# Patient Record
Sex: Female | Born: 2000 | Race: White | Hispanic: No | Marital: Single | Smoking: Never smoker
Health system: Southern US, Community
[De-identification: ages and names within clinical notes are randomized; demographics above are authoritative.]

## PROBLEM LIST (undated history)

## (undated) DIAGNOSIS — F909 Attention-deficit hyperactivity disorder, unspecified type: Secondary | ICD-10-CM

## (undated) DIAGNOSIS — G4710 Hypersomnia, unspecified: Secondary | ICD-10-CM

## (undated) DIAGNOSIS — J45909 Unspecified asthma, uncomplicated: Secondary | ICD-10-CM

## (undated) DIAGNOSIS — E669 Obesity, unspecified: Secondary | ICD-10-CM

## (undated) DIAGNOSIS — D649 Anemia, unspecified: Secondary | ICD-10-CM

## (undated) DIAGNOSIS — F410 Panic disorder [episodic paroxysmal anxiety] without agoraphobia: Secondary | ICD-10-CM

## (undated) DIAGNOSIS — K219 Gastro-esophageal reflux disease without esophagitis: Secondary | ICD-10-CM

## (undated) DIAGNOSIS — R519 Headache, unspecified: Secondary | ICD-10-CM

## (undated) DIAGNOSIS — R011 Cardiac murmur, unspecified: Secondary | ICD-10-CM

## (undated) DIAGNOSIS — K3184 Gastroparesis: Secondary | ICD-10-CM

## (undated) DIAGNOSIS — R51 Headache: Secondary | ICD-10-CM

## (undated) DIAGNOSIS — A048 Other specified bacterial intestinal infections: Secondary | ICD-10-CM

## (undated) HISTORY — DX: Anemia, unspecified: D64.9

## (undated) HISTORY — DX: Hypersomnia, unspecified: G47.10

## (undated) HISTORY — PX: TYMPANOSTOMY TUBE PLACEMENT: SHX32

---
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
## 2018-04-19 ENCOUNTER — Emergency Department (HOSPITAL_COMMUNITY): Payer: Medicaid Other

## 2018-04-19 ENCOUNTER — Other Ambulatory Visit: Payer: Self-pay

## 2018-04-19 ENCOUNTER — Emergency Department (HOSPITAL_COMMUNITY)
Admission: EM | Admit: 2018-04-19 | Discharge: 2018-04-19 | Disposition: A | Discharge status: Home / Self Care | Payer: Medicaid Other | Attending: Emergency Medicine | Admitting: Emergency Medicine

## 2018-04-19 ENCOUNTER — Encounter (HOSPITAL_COMMUNITY): Payer: Self-pay | Admitting: Emergency Medicine

## 2018-04-19 DIAGNOSIS — S46911A Strain of unspecified muscle, fascia and tendon at shoulder and upper arm level, right arm, initial encounter: Secondary | ICD-10-CM

## 2018-04-19 DIAGNOSIS — Z79899 Other long term (current) drug therapy: Secondary | ICD-10-CM | POA: Diagnosis not present

## 2018-04-19 DIAGNOSIS — Y999 Unspecified external cause status: Secondary | ICD-10-CM | POA: Insufficient documentation

## 2018-04-19 DIAGNOSIS — Y9375 Activity, martial arts: Secondary | ICD-10-CM | POA: Diagnosis not present

## 2018-04-19 DIAGNOSIS — Y929 Unspecified place or not applicable: Secondary | ICD-10-CM | POA: Insufficient documentation

## 2018-04-19 DIAGNOSIS — S4991XA Unspecified injury of right shoulder and upper arm, initial encounter: Secondary | ICD-10-CM | POA: Diagnosis present

## 2018-04-19 DIAGNOSIS — W2209XA Striking against other stationary object, initial encounter: Secondary | ICD-10-CM | POA: Diagnosis not present

## 2018-04-19 DIAGNOSIS — J45909 Unspecified asthma, uncomplicated: Secondary | ICD-10-CM | POA: Insufficient documentation

## 2018-04-19 HISTORY — DX: Gastroparesis: K31.84

## 2018-04-19 MED ORDER — IBUPROFEN 600 MG PO TABS: 600.0000 mg | ORAL_TABLET | Freq: Four times a day (QID) | ORAL | 0 refills | Status: DC | PRN

## 2018-04-19 NOTE — ED Triage Notes (Signed)
Pt was rolling on the ground and felt her R shoulder pop. Reports she took naproxen and motrin without improvement.

## 2018-04-19 NOTE — Discharge Instructions (Addendum)
Apply ice packs on/off to your shoulder.  Only wear the sling for brief periods of time, remove it and gently move your shoulder to prevent your shoulder from locking up.  Call the orthopedic provider listed in one week if the symptoms are not improving.

## 2018-04-19 NOTE — ED Notes (Signed)
At martial arts class fell wrong onto shoulder   Now with shoulder pain and discomfort

## 2018-04-20 NOTE — ED Provider Notes (Signed)
[facility] EMERGENCY DEPARTMENT Provider Note   CSN: [id number] Arrival date & time: 04/19/18  2015     History   Chief Complaint Chief Complaint  Patient presents with  . Shoulder Pain    HPI Gina Scott is a 17 y.o. female.  HPI  Gina Scott is a 17 y.o. female who presents to the Emergency Department complaining of right shoulder pain and felt a "pop" in her shoulder two days ago after performing a jujitsu maneuver.  She describes pain with abduction of her arm.  Pain improves with her arm held to her side.  She has been wearing a sling.  She denies neck pain, swelling, chest pain or pain radiating down her arm.  Has taken aleve without relief.    Past Medical History:  Diagnosis Date  . ADHD (attention deficit hyperactivity disorder)   . Anemia   . Asthma   . Gastroparesis   . GERD (gastroesophageal reflux disease)   . H. pylori infection    PMH:  . Headache   . Heart murmur    as an infant  . Obesity (BMI 30-39.9)   . Panic attacks     There are no active problems to display for this patient.   Past Surgical History:  Procedure Laterality Date  . ESOPHAGOGASTRODUODENOSCOPY (EGD) WITH PROPOFOL N/A 05/21/2017   Procedure: ESOPHAGOGASTRODUODENOSCOPY (EGD) WITH PROPOFOL;  Surgeon: Adelene Amas, MD;  Location: [facility] ENDOSCOPY;  Service: Gastroenterology;  Laterality: N/A;  . TYMPANOSTOMY TUBE PLACEMENT       OB History   None      Home Medications    Prior to Admission medications   Medication Sig Start Date End Date Taking? Authorizing Provider  acetaminophen (TYLENOL) 500 MG tablet Take 1,000 mg by mouth every 6 (six) hours as needed for moderate pain.    [provider]  albuterol (PROVENTIL HFA;VENTOLIN HFA) 108 (90 Base) MCG/ACT inhaler Inhale 2 puffs into the lungs every 4 (four) hours as needed for wheezing or shortness of breath.     [provider]  amitriptyline (ELAVIL) 10 MG tablet 10 mg (1 tab) and adjust as  recommended by MD.  Max dose 5 per day. 12/10/17   Adelene Amas, MD  Carnitine 250 MG CAPS Take 4 capsules (1,000 mg total) by mouth 2 (two) times daily. 04/04/17   Adelene Amas, MD  Coenzyme Q10 (COQ-10) 100 MG CAPS Take 1 capsule by mouth 2 (two) times daily. 04/04/17   Adelene Amas, MD  esomeprazole (NEXIUM) 40 MG capsule Take 1 capsule 20 minutes before a meal, daily. 05/27/17   Adelene Amas, MD  ibuprofen (ADVIL,MOTRIN) 600 MG tablet Take 1 tablet (600 mg total) by mouth every 6 (six) hours as needed. 04/19/18   Elo Marmolejos, PA-C  IRON PO Take 65 mg by mouth daily.    [provider]  magic mouthwash w/lidocaine SOLN Take 15 mls into mouth, gargle, and spit out up to 3 x per day 05/22/17   Adelene Amas, MD  metroNIDAZOLE (METROGEL VAGINAL) 0.75 % vaginal gel Place 1 Applicatorful vaginally at bedtime. X 5 nights, no alcohol or sex while using 09/18/16   Cheral Marker, CNM  omeprazole (PRILOSEC) 20 MG capsule Take 20 mg by mouth daily.    [provider]  ondansetron (ZOFRAN ODT) 8 MG disintegrating tablet Take 1 tablet (8 mg total) by mouth every 8 (eight) hours as needed for nausea or vomiting. 10/03/17   Adelene Amas, MD  Probiotic Product (  ALIGN PO) Take by mouth.    [provider]  promethazine (PHENERGAN) 25 MG suppository Place 1 suppository (25 mg total) rectally every 6 (six) hours as needed for nausea or vomiting. 10/31/17   Adelene AmasQuan, Richard, MD  promethazine (PHENERGAN) 25 MG tablet Take 1 tablet (25 mg total) by mouth every 6 (six) hours as needed for nausea or vomiting. 10/31/17   Adelene AmasQuan, Richard, MD  sucralfate (CARAFATE) 1 GM/10ML suspension Take 10 mLs (1 g total) by mouth 4 (four) times daily -  with meals and at bedtime. Gargle and spit out. 05/22/17   Adelene AmasQuan, Richard, MD    Family History Family History  Problem Relation Age of Onset  . Stroke Maternal Grandmother   . Hypertension Mother   . Multiple sclerosis Mother     Social History Social  History   Tobacco Use  . Smoking status: Never Smoker  . Smokeless tobacco: Never Used  Substance Use Topics  . Alcohol use: No  . Drug use: No     Allergies   Dairy aid [lactase]   Review of Systems Review of Systems  Constitutional: Negative for chills and fever.  Respiratory: Negative for shortness of breath.   Cardiovascular: Negative for chest pain.  Musculoskeletal: Positive for arthralgias (right shoulder pain). Negative for joint swelling, neck pain and neck stiffness.  Skin: Negative for rash and wound.  Neurological: Negative for weakness and numbness.     Physical Exam Updated Vital Signs BP 123/88 (BP Location: Left Arm)   Pulse 99   Temp (!) 97.2 F (36.2 C) (Tympanic)   Resp 17   Ht 5\' 4"  (1.626 m)   Wt 86.6 kg (191 lb)   LMP 04/12/2018   SpO2 98%   BMI 32.79 kg/m   Physical Exam  Cardiovascular: Normal rate, regular rhythm and intact distal pulses.  Pulmonary/Chest: Effort normal and breath sounds normal. No respiratory distress. She exhibits no tenderness.  Musculoskeletal: She exhibits tenderness. She exhibits no edema.  ttp of the anterior right shoulder and right trapezius muscle through ROM.  No cervical spine or paraspinal muscle tenderness.  Grip strength is strong and symmetrical.  Neurological: She is alert. No sensory deficit.  Skin: Skin is warm. Capillary refill takes less than 2 seconds. No rash noted. No erythema.  Psychiatric: She has a normal mood and affect.  Nursing note and vitals reviewed.    ED Treatments / Results  Labs (all labs ordered are listed, but only abnormal results are displayed) Labs Reviewed - No data to display  EKG None  Radiology Dg Shoulder Right  Result Date: 04/19/2018 CLINICAL DATA:  Right shoulder pain after injury EXAM: RIGHT SHOULDER - 2+ VIEW COMPARISON:  None. FINDINGS: There is no evidence of fracture or dislocation. There is no evidence of arthropathy or other focal bone abnormality. Soft  tissues are unremarkable. IMPRESSION: Negative. Electronically Signed   By: Delbert PhenixJason A Poff M.D.   On: 04/19/2018 20:48    Procedures Procedures (including critical care time)  Medications Ordered in ED Medications - No data to display   Initial Impression / Assessment and Plan / ED Course  I have reviewed the triage vital signs and the nursing notes.  Pertinent labs & imaging results that were available during my care of the patient were reviewed by me and considered in my medical decision making (see chart for details).    XR of shoulder is neg for fx.  NV intact.  No focal neuro deficit.  Likely strain, but  also discussed possibility of rotator cuff injury and need for close orthopedic f/u.  Pt agrees to plan.  Return precautions discussed. Also advised pt not to wear shoulder sling for extended time due to risk of adhesive capsulitis.     Final Clinical Impressions(s) / ED Diagnoses   Final diagnoses:  Strain of right shoulder, initial encounter    ED Discharge Orders        Ordered    ibuprofen (ADVIL,MOTRIN) 600 MG tablet  Every 6 hours PRN     04/19/18 2104       Pauline Aus, PA-C 04/20/18 2019    Benjiman Core, MD 04/20/18 2348

## 2018-06-19 ENCOUNTER — Encounter: Payer: Self-pay | Admitting: Internal Medicine

## 2018-08-04 DIAGNOSIS — E669 Obesity, unspecified: Secondary | ICD-10-CM | POA: Diagnosis not present

## 2018-08-04 DIAGNOSIS — R2689 Other abnormalities of gait and mobility: Secondary | ICD-10-CM | POA: Diagnosis not present

## 2018-08-04 DIAGNOSIS — Z308 Encounter for other contraceptive management: Secondary | ICD-10-CM | POA: Diagnosis not present

## 2018-08-04 DIAGNOSIS — M79672 Pain in left foot: Secondary | ICD-10-CM | POA: Diagnosis not present

## 2018-08-04 DIAGNOSIS — M799 Soft tissue disorder, unspecified: Secondary | ICD-10-CM | POA: Diagnosis not present

## 2018-08-04 DIAGNOSIS — Z23 Encounter for immunization: Secondary | ICD-10-CM | POA: Diagnosis not present

## 2018-08-05 ENCOUNTER — Other Ambulatory Visit (HOSPITAL_COMMUNITY): Payer: Self-pay | Admitting: Family Medicine

## 2018-08-05 ENCOUNTER — Ambulatory Visit: Payer: BLUE CROSS/BLUE SHIELD | Admitting: Gastroenterology

## 2018-08-05 ENCOUNTER — Encounter: Payer: Self-pay | Admitting: Gastroenterology

## 2018-08-05 ENCOUNTER — Ambulatory Visit (HOSPITAL_COMMUNITY)
Admission: RE | Admit: 2018-08-05 | Discharge: 2018-08-05 | Disposition: A | Discharge status: Home / Self Care | Payer: BLUE CROSS/BLUE SHIELD | Source: Ambulatory Visit | Attending: Family Medicine | Admitting: Family Medicine

## 2018-08-05 DIAGNOSIS — R1013 Epigastric pain: Secondary | ICD-10-CM | POA: Diagnosis not present

## 2018-08-05 DIAGNOSIS — R112 Nausea with vomiting, unspecified: Secondary | ICD-10-CM | POA: Diagnosis not present

## 2018-08-05 DIAGNOSIS — M79672 Pain in left foot: Secondary | ICD-10-CM | POA: Insufficient documentation

## 2018-08-05 DIAGNOSIS — K219 Gastro-esophageal reflux disease without esophagitis: Secondary | ICD-10-CM

## 2018-08-05 MED ORDER — PANTOPRAZOLE SODIUM 40 MG PO TBEC: 40.0000 mg | DELAYED_RELEASE_TABLET | Freq: Every day | ORAL | 3 refills | Status: DC

## 2018-08-05 NOTE — Patient Instructions (Signed)
Start pantoprazole 40 mg daily before your first meal of the day.  Prescription sent to your pharmacy.  This medicine is for dyspepsia and acid reflux (your biopsies previously showed reflux).  Try adjusting when you take amitriptyline, take at bedtime. The following are the instructions provided by your pediatric GI doctor when you were started on amitriptyline. The goal is to have your symptoms adequately controlled but take lowest minimum dose.   Start with 1 tablet at bed for 2 weeks and then will increase by 1/2 a tablet every 2 wks with a max dose of 5 tabs.  1.5 tabs x 2 wks  2 tabs x 2 wks  2.5 tabs x 2 wks  3 tabs x 2 wks  3.5 tabs x 2 wks  4 tabs x 2 wks  4.5 tabs x 2 wks  5 tabs x 2 wks   Return to the office in two months to meet Dr. Jena Gauss and follow up your symptoms. Call in interim if needed.

## 2018-08-05 NOTE — Progress Notes (Signed)
Primary Care Physician:  Elfredia Nevins, MD  Primary Gastroenterologist:  Roetta Sessions, MD   Chief Complaint  Patient presents with  . Constipation    states she has gastroparesis  . Nausea  . Emesis    HPI:  Gina Scott is a 17 y.o. female here to establish care.  Previously followed by pediatric gastroenterologist Dr. Adelene Amas.  Patient gives a history of gastroparesis although I don't see documentation of gastric emptying study. Patient reports her mother has gastroparesis and Dr. Cloretta Ned never checked her for it.  Last seen Dr. Cloretta Ned in February 2019 for follow-up for nausea, constipation, reflux, vomiting.  According to his note, patient has previously tried co-Q10, l-carnitine, riboflavin, magnesium.  Phenergan has been most effective.  Zofran ineffective.  Started on amitriptyline about 6 months ago.  Treatment plan included involving pediatric neurology if she failed amitriptyline therapy, to exclude significant neurological disease and to help manage her symptoms.  EGD back in July, showed erythematous mucosa in the antrum, small bowel biopsies negative, esophageal biopsies consistent with reflux, gastric biopsies with active gastropathy but no H. pylori.  H. pylori stool antigen negative in June 2018.  She reports being treated for H. pylori several years back.  Reviewed available records in epic.  She also saw Dr. Julien Girt, pediatric gastroenterology at [facility] in May 2017.  At that time patient was reporting heartburn and reflux before vomiting.  For H. pylori at that time.  Suspected lactose intolerance, advised to begin dairy free diet.  Noted some improvement in her symptoms at that time.  She also had a food allergy test May 2017 which tested negative for egg white, cows milk, fish cod, wheat, peanut, soy, egg yolk, corn.  CRP was normal at 5.6.  In June 2018, CRP normal, sed rate normal, lactoferrin negative, H. pylori stool antigen negative,  Hemoccult negative.  Ferritin 21, iron 32, TIBC 360, iron saturations 9% all normal.  Patient states that her symptoms are fairly well-controlled currently but there is some room for improvement.  He takes her amitriptyline 10 mg every morning.  She always wakes up feeling nauseated.  Easily her nausea will settle down, sometimes takes an additional amitriptyline later in the day.  Usually does this about once every couple of weeks.  Denies associated drowsiness.  She states she is awaiting sleep study because of personal and family history of hypersomnolence.  Vomiting rare at this point.  Bowel movements fairly regular, at least 4 times weekly, Bristol 4 and Bristol 6.  No melena or rectal bleeding.  Denies heartburn.  Does have some epigastric burning at times.  Patient has gained 20 pounds in the past 2 years, up 7 pounds in the past 3 months.  Current Outpatient Medications  Medication Sig Dispense Refill  . acetaminophen (TYLENOL) 500 MG tablet Take 1,000 mg by mouth every 6 (six) hours as needed for moderate pain.    Marland Kitchen albuterol (PROVENTIL HFA;VENTOLIN HFA) 108 (90 Base) MCG/ACT inhaler Inhale 2 puffs into the lungs every 4 (four) hours as needed for wheezing or shortness of breath.     Marland Kitchen amitriptyline (ELAVIL) 10 MG tablet 10 mg (1 tab) and adjust as recommended by MD.  Max dose 5 per day. (Patient taking differently: Take 10 mg by mouth daily. 10 mg (1 tab) and adjust as recommended by MD.  Max dose 5 per day.) 150 tablet 1  . Coenzyme Q10 (COQ-10) 100 MG CAPS Take 1 capsule by  mouth 2 (two) times daily. 60 each 1  . ondansetron (ZOFRAN ODT) 8 MG disintegrating tablet Take 1 tablet (8 mg total) by mouth every 8 (eight) hours as needed for nausea or vomiting. 20 tablet 0   No current facility-administered medications for this visit.     Allergies as of 08/05/2018 - Review Complete 08/05/2018  Allergen Reaction Noted  . Dairy aid [lactase]  12/14/2016    Past Medical History:  Diagnosis  Date  . ADHD (attention deficit hyperactivity disorder)   . Anemia   . Asthma   . Gastroparesis   . GERD (gastroesophageal reflux disease)   . H. pylori infection    PMH:  . Headache   . Heart murmur    as an infant  . Obesity (BMI 30-39.9)   . Panic attacks     Past Surgical History:  Procedure Laterality Date  . ESOPHAGOGASTRODUODENOSCOPY (EGD) WITH PROPOFOL N/A 05/21/2017   Dr. Cloretta Ned: Erythematous mucosa in the antrum, gastric biopsies with active gastropathy but no H. pylori, small bowel biopsies negative, esophageal biopsies consistent with reflux.  . TYMPANOSTOMY TUBE PLACEMENT      Family History  Problem Relation Age of Onset  . Stroke Maternal Grandmother   . Hypertension Mother   . Multiple sclerosis Mother   . Irritable bowel syndrome Mother   . Other Mother        gastroparesis  . Inflammatory bowel disease Neg Hx   . Colon cancer Neg Hx   . Liver cancer Neg Hx   . Cirrhosis Neg Hx     Social History   Socioeconomic History  . Marital status: Single    Spouse name: Not on file  . Number of children: Not on file  . Years of education: Not on file  . Highest education level: Not on file  Occupational History  . Not on file  Social Needs  . Financial resource strain: Not on file  . Food insecurity:    Worry: Not on file    Inability: Not on file  . Transportation needs:    Medical: Not on file    Non-medical: Not on file  Tobacco Use  . Smoking status: Never Smoker  . Smokeless tobacco: Never Used  Substance and Sexual Activity  . Alcohol use: No  . Drug use: No  . Sexual activity: Never  Lifestyle  . Physical activity:    Days per week: Not on file    Minutes per session: Not on file  . Stress: Not on file  Relationships  . Social connections:    Talks on phone: Not on file    Gets together: Not on file    Attends religious service: Not on file    Active member of club or organization: Not on file    Attends meetings of clubs or  organizations: Not on file    Relationship status: Not on file  . Intimate partner violence:    Fear of current or ex partner: Not on file    Emotionally abused: Not on file    Physically abused: Not on file    Forced sexual activity: Not on file  Other Topics Concern  . Not on file  Social History Narrative  . Not on file      ROS:  General: Negative for anorexia, weight loss, fever, chills, fatigue, weakness. Eyes: Negative for vision changes.  ENT: Negative for hoarseness, difficulty swallowing , nasal congestion. CV: Negative for chest pain, angina, palpitations, dyspnea on  exertion, peripheral edema.  Respiratory: Negative for dyspnea at rest, dyspnea on exertion, cough, sputum, wheezing.  GI: See history of present illness. GU:  Negative for dysuria, hematuria, urinary incontinence, urinary frequency, nocturnal urination.  MS: Negative low back pain.  Left foot pain, x-rays pending. Derm: Negative for rash or itching.  Neuro: Negative for weakness, abnormal sensation, seizure, frequent headaches, memory loss, confusion.  Psych: Negative for anxiety, depression, suicidal ideation, hallucinations.  Endo: Negative for unusual weight change.  Heme: Negative for bruising or bleeding. Allergy: Negative for rash or hives.    Physical Examination:  BP 138/74   Pulse 100   Temp (!) 97.4 F (36.3 C) (Oral)   Ht 5\' 4"  (1.626 m)   Wt 198 lb 12.8 oz (90.2 kg)   LMP 08/04/2018 (Exact Date)   BMI 34.12 kg/m    General: Well-nourished, well-developed in no acute distress.  Head: Normocephalic, atraumatic.   Eyes: Conjunctiva pink, no icterus. Mouth: Oropharyngeal mucosa moist and pink , no lesions erythema or exudate. Neck: Supple without thyromegaly, masses, or lymphadenopathy.  Lungs: Clear to auscultation bilaterally.  Heart: Regular rate and rhythm, no murmurs rubs or gallops.  Abdomen: Bowel sounds are normal, nontender, nondistended, no hepatosplenomegaly or masses, no  abdominal bruits or    hernia , no rebound or guarding.   Rectal: not performed Extremities: No lower extremity edema. No clubbing or deformities.  Neuro: Alert and oriented x 4 , grossly normal neurologically.  Skin: Warm and dry, no rash or jaundice.   Psych: Alert and cooperative, normal mood and affect.  Labs: See above  Imaging Studies: No results found.

## 2018-08-05 NOTE — Assessment & Plan Note (Addendum)
17 year old female with history of chronic nausea, intermittent vomiting, epigastric burning previously followed by pediatric gastroenterology.  Food allergy testing negative. Small bowel biopsy negative for celiac. At some point in the past 6 months she has come off of PPI.  Continues to have some epigastric burning, nausea predominantly in the morning but rarely vomiting at this point.  I do not see a clear diagnosis of gastroparesis patient reports.  She admits to no specific testing which is a gastric emptying study.  Pediatric notes suspect IBS, dyspepsia, GERD.  At this point, would recommend optimizing her amitriptyline which she was started on about 6 months ago.  Would ask her to take dose at bedtime.  Right now she is only on 10 mg daily would likely benefit from increased dose.  Instructions for step up therapy as outlined by her previous gastroenterologist provided with patient today.  She would likely not need much more than 20 mg at bedtime control her symptoms based on how she is doing currently on 10 mg.  We will add back PPI.  Have her return to the office 2 months to see Dr. Jena Gauss.  She will call sooner if needed.

## 2018-08-06 NOTE — Progress Notes (Signed)
cc'ed to pcp °

## 2018-09-30 ENCOUNTER — Encounter: Payer: Self-pay | Admitting: Internal Medicine

## 2018-09-30 ENCOUNTER — Ambulatory Visit: Payer: BLUE CROSS/BLUE SHIELD | Admitting: Internal Medicine

## 2018-09-30 ENCOUNTER — Telehealth: Payer: Self-pay

## 2018-09-30 VITALS — BP 147/89 | HR 113 | Temp 97.8°F | Ht 64.0 in | Wt 201.2 lb

## 2018-09-30 DIAGNOSIS — K219 Gastro-esophageal reflux disease without esophagitis: Secondary | ICD-10-CM | POA: Diagnosis not present

## 2018-09-30 NOTE — Patient Instructions (Addendum)
Continue amitriptyline 30 mg at bedtime  Try protonix (pantoprazole) 40 mg daily (disp 30 with 11 refilles  Stop Nexiium  Gastroparesis / GERD diet information  Office visit in 8 weeks

## 2018-09-30 NOTE — Progress Notes (Signed)
Primary Care Physician:  Elfredia Nevins, MD Primary Gastroenterologist:  Dr. Jena Gauss  Pre-Procedure History & Physical: HPI:  Gina Scott is a 17 y.o. female here for follow-up of GERD/gastroparesis.  Seen 2 months ago.  Started on Protonix but did not actually get the prescription filled.  We checked with the pharmacy it was actually filled but not picked up.  She continues on esomeprazole 20 mg daily.  Can't  tell a great deal of difference 1 way or other she takes as omeprazole or does not.  Still has upper abdominal discomfort and typical reflux symptoms.  No dysphagia.  Rare nausea.  Rarely takes a Phenergan.  Taking amitriptyline 30 mg at bedtime.  This helps her sleep very well.  Feels it does settle her stomach down to some degree.  Little dry mouth.  No constipation or urinary tract symptoms.  Past Medical History:  Diagnosis Date  . ADHD (attention deficit hyperactivity disorder)   . Anemia   . Asthma   . Gastroparesis   . GERD (gastroesophageal reflux disease)   . H. pylori infection    PMH:  . Headache   . Heart murmur    as an infant  . Obesity (BMI 30-39.9)   . Panic attacks     Past Surgical History:  Procedure Laterality Date  . ESOPHAGOGASTRODUODENOSCOPY (EGD) WITH PROPOFOL N/A 05/21/2017   Dr. Cloretta Ned: Erythematous mucosa in the antrum, gastric biopsies with active gastropathy but no H. pylori, small bowel biopsies negative, esophageal biopsies consistent with reflux.  . TYMPANOSTOMY TUBE PLACEMENT      Prior to Admission medications   Medication Sig Start Date End Date Taking? Authorizing Provider  acetaminophen (TYLENOL) 500 MG tablet Take 1,000 mg by mouth every 6 (six) hours as needed for moderate pain.   Yes [provider]  albuterol (PROVENTIL HFA;VENTOLIN HFA) 108 (90 Base) MCG/ACT inhaler Inhale 2 puffs into the lungs every 4 (four) hours as needed for wheezing or shortness of breath.    Yes [provider]  amitriptyline (ELAVIL) 10  MG tablet 10 mg (1 tab) and adjust as recommended by MD.  Max dose 5 per day. Patient taking differently: Take 30 mg by mouth daily. 10 mg (1 tab) and adjust as recommended by MD.  Max dose 5 per day. 12/10/17  Yes Adelene Amas, MD  Coenzyme Q10 (COQ-10) 100 MG CAPS Take 1 capsule by mouth 2 (two) times daily. 04/04/17  Yes Adelene Amas, MD  esomeprazole (NEXIUM) 20 MG capsule Take 20 mg by mouth daily.   Yes [provider]  ondansetron (ZOFRAN ODT) 8 MG disintegrating tablet Take 1 tablet (8 mg total) by mouth every 8 (eight) hours as needed for nausea or vomiting. Patient not taking: Reported on 09/30/2018 10/03/17   Adelene Amas, MD  pantoprazole (PROTONIX) 40 MG tablet Take 1 tablet (40 mg total) by mouth daily before breakfast. Patient not taking: Reported on 09/30/2018 08/05/18   Tiffany Kocher, PA-C    Allergies as of 09/30/2018 - Review Complete 09/30/2018  Allergen Reaction Noted  . Dairy aid [lactase]  12/14/2016    Family History  Problem Relation Age of Onset  . Stroke Maternal Grandmother   . Hypertension Mother   . Multiple sclerosis Mother   . Irritable bowel syndrome Mother   . Other Mother        gastroparesis  . Inflammatory bowel disease Neg Hx   . Colon cancer Neg Hx   . Liver cancer Neg  Hx   . Cirrhosis Neg Hx     Social History   Socioeconomic History  . Marital status: Single    Spouse name: Not on file  . Number of children: Not on file  . Years of education: Not on file  . Highest education level: Not on file  Occupational History  . Not on file  Social Needs  . Financial resource strain: Not on file  . Food insecurity:    Worry: Not on file    Inability: Not on file  . Transportation needs:    Medical: Not on file    Non-medical: Not on file  Tobacco Use  . Smoking status: Never Smoker  . Smokeless tobacco: Never Used  Substance and Sexual Activity  . Alcohol use: No  . Drug use: No  . Sexual activity: Never  Lifestyle  .  Physical activity:    Days per week: Not on file    Minutes per session: Not on file  . Stress: Not on file  Relationships  . Social connections:    Talks on phone: Not on file    Gets together: Not on file    Attends religious service: Not on file    Active member of club or organization: Not on file    Attends meetings of clubs or organizations: Not on file    Relationship status: Not on file  . Intimate partner violence:    Fear of current or ex partner: Not on file    Emotionally abused: Not on file    Physically abused: Not on file    Forced sexual activity: Not on file  Other Topics Concern  . Not on file  Social History Narrative  . Not on file    Review of Systems: See HPI, otherwise negative ROS  Physical Exam: BP (!) 147/89   Pulse (!) 113   Temp 97.8 F (36.6 C) (Oral)   Ht 5\' 4"  (1.626 m)   Wt 201 lb 3.2 oz (91.3 kg)   LMP 09/02/2018   BMI 34.54 kg/m  General:   Alert, somewhat depressed mood and flat affect otherwise pleasant and cooperative in NAD Lungs:  Clear throughout to auscultation.   No wheezes, crackles, or rhonchi. No acute distress. Heart:  Regular rate and rhythm; no murmurs, clicks, rubs,  or gallops. Abdomen: Non-distended, normal bowel sounds.  Soft and nontender without appreciable mass or hepatosplenomegaly.  Pulses:  Normal pulses noted. Extremities:  Without clubbing or edema.  Impression: 17 year old lady with ADHD, GERD, diagnosis of gastroparesis made elsewhere; she continues to have some symptoms consistent with dyspepsia and GERD.  A degree of symptomatic gastroparesis not excluded.  She did not get her Protonix filled.  A stronger dose of PPI may be be of great benefit here.   Recommendations: Continue amitriptyline 30 mg at bedtime  Try protonix (pantoprazole) 40 mg daily (disp 30 with 11 refilles)  Stop Nexiium  Gastroparesis / GERD diet information  Office visit in 8 weeks    Notice: This dictation was prepared with  Dragon dictation along with smaller phrase technology. Any transcriptional errors that result from this process are unintentional and may not be corrected upon review.

## 2018-09-30 NOTE — Telephone Encounter (Signed)
Pt told Dr. Jena Gaussourk she never got Protonix in October 2019. I called [facility] [place], who said it was filled on 08/05/2018 and not picked up, then put back in stock. He said he could fill it and I asked him to do so. Pt is aware to pick up the Rx, said she will need to wait til she gets paid next week.  [place] said it would be $14.00 and pt is aware, said she will have to wait til next week when she gets pd to pick it up. I told her to make sure she communicates with the pharmacist on the prescription.

## 2018-11-25 ENCOUNTER — Ambulatory Visit: Payer: BLUE CROSS/BLUE SHIELD | Admitting: Internal Medicine

## 2018-11-25 ENCOUNTER — Encounter: Payer: Self-pay | Admitting: Internal Medicine

## 2018-11-25 VITALS — BP 144/86 | HR 118 | Temp 97.3°F | Ht 64.0 in | Wt 197.2 lb

## 2018-11-25 DIAGNOSIS — K219 Gastro-esophageal reflux disease without esophagitis: Secondary | ICD-10-CM

## 2018-11-25 DIAGNOSIS — R194 Change in bowel habit: Secondary | ICD-10-CM | POA: Diagnosis not present

## 2018-11-25 NOTE — Progress Notes (Signed)
Primary Care Physician:  Elfredia NevinsFusco, Lawrence, MD Primary Gastroenterologist:  Dr. Jena Gaussourk  Pre-Procedure History & Physical: HPI:  Gina Scott is a 18 y.o. female here for follow-up of GERD.  She is actually doing very well now on a higher dose of PPI.  Reflux symptoms well controlled.  No odynophagia or dysphagia.  Denies abdominal pain;  weight up 1 pound since she was last here.  No change in bowel habits.  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  Now taking amitriptyline 30 mg nightly-tolerating it well.  Prescribed by pediatric gastroenterology.  Past Medical History:  Diagnosis Date  . ADHD (attention deficit hyperactivity disorder)   . Anemia   . Asthma   . Gastroparesis   . GERD (gastroesophageal reflux disease)   . H. pylori infection    PMH:  . Headache   . Heart murmur    as an infant  . Obesity (BMI 30-39.9)   . Panic attacks     Past Surgical History:  Procedure Laterality Date  . ESOPHAGOGASTRODUODENOSCOPY (EGD) WITH PROPOFOL N/A 05/21/2017   Dr. Cloretta NedQuan: Erythematous mucosa in the antrum, gastric biopsies with active gastropathy but no H. pylori, small bowel biopsies negative, esophageal biopsies consistent with reflux.  . TYMPANOSTOMY TUBE PLACEMENT      Prior to Admission medications   Medication Sig Start Date End Date Taking? Authorizing Provider  albuterol (PROVENTIL HFA;VENTOLIN HFA) 108 (90 Base) MCG/ACT inhaler Inhale 2 puffs into the lungs every 4 (four) hours as needed for wheezing or shortness of breath.    Yes [provider]  amitriptyline (ELAVIL) 10 MG tablet 10 mg (1 tab) and adjust as recommended by MD.  Max dose 5 per day. Patient taking differently: Take 30 mg by mouth daily. 10 mg (1 tab) and adjust as recommended by MD.  Max dose 5 per day. 12/10/17  Yes Adelene AmasQuan, Richard, MD  Coenzyme Q10 (COQ-10) 100 MG CAPS Take 1 capsule by mouth 2 (two)  times daily. 04/04/17  Yes Adelene AmasQuan, Richard, MD  pantoprazole (PROTONIX) 40 MG tablet Take 1 tablet (40 mg total) by mouth daily before breakfast. 08/05/18  Yes Tiffany KocherLewis, Leslie S, PA-C  acetaminophen (TYLENOL) 500 MG tablet Take 1,000 mg by mouth every 6 (six) hours as needed for moderate pain.    [provider]  esomeprazole (NEXIUM) 20 MG capsule Take 20 mg by mouth daily.    [provider]  ondansetron (ZOFRAN ODT) 8 MG disintegrating tablet Take 1 tablet (8 mg total) by mouth every 8 (eight) hours as needed for nausea or vomiting. Patient not taking: Reported on 09/30/2018 10/03/17   Adelene AmasQuan, Richard, MD    Allergies as of 11/25/2018 - Review Complete 11/25/2018  Allergen Reaction Noted  . Dairy aid [lactase]  12/14/2016    Family History  Problem Relation Age of Onset  . Stroke Maternal Grandmother   . Hypertension Mother   . Multiple sclerosis Mother   . Irritable bowel syndrome Mother   . Other Mother        gastroparesis  . Inflammatory bowel disease Neg Hx   . Colon cancer Neg Hx   . Liver cancer Neg Hx   . Cirrhosis Neg Hx     Social History   Socioeconomic History  . Marital status: Single    Spouse name: Not on file  . Number of children: Not on file  . Years  of education: Not on file  . Highest education level: Not on file  Occupational History  . Not on file  Social Needs  . Financial resource strain: Not on file  . Food insecurity:    Worry: Not on file    Inability: Not on file  . Transportation needs:    Medical: Not on file    Non-medical: Not on file  Tobacco Use  . Smoking status: Never Smoker  . Smokeless tobacco: Never Used  Substance and Sexual Activity  . Alcohol use: No  . Drug use: No  . Sexual activity: Never  Lifestyle  . Physical activity:    Days per week: Not on file    Minutes per session: Not on file  . Stress: Not on file  Relationships  . Social connections:    Talks on phone: Not on file    Gets together: Not  on file    Attends religious service: Not on file    Active member of club or organization: Not on file    Attends meetings of clubs or organizations: Not on file    Relationship status: Not on file  . Intimate partner violence:    Fear of current or ex partner: Not on file    Emotionally abused: Not on file    Physically abused: Not on file    Forced sexual activity: Not on file  Other Topics Concern  . Not on file  Social History Narrative  . Not on file    Review of Systems: See HPI, otherwise negative ROS  Physical Exam: BP (!) 144/86   Pulse (!) 118   Temp (!) 97.3 F (36.3 C) (Oral)   Ht 5\' 4"  (1.626 m)   Wt 197 lb 3.2 oz (89.4 kg)   LMP 11/19/2018   BMI 33.85 kg/m  General:   Alert,  Well-developed, well-nourished, pleasant and cooperative in NAD Neck:  Supple; no masses or thyromegaly. No significant cervical adenopathy. Lungs:  Clear throughout to auscultation.   No wheezes, crackles, or rhonchi. No acute distress. Heart:  Regular rate and rhythm; no murmurs, clicks, rubs,  or gallops. Abdomen: Non-distended, normal bowel sounds.  Soft and nontender without appreciable mass or hepatosplenomegaly.     Impression/Plan: Pleasant 18 year old lady with GI issues that amount to GERD which are now well controlled on Protonix 40 mg daily.  She is virtually asymptomatic from a GI standpoint otherwise.  She reportedly has a family history of gastroparesis.  She currently has no symptoms consistent with that entity nor she had a recent emptying study.  Recommendations:Continue protonix 40 mg daily  OK to continue amitriptyline.  GERD information provided  Office visit in 1 year   Notice: This dictation was prepared with Dragon dictation along with smaller phrase technology. Any transcriptional errors that result from this process are unintentional and may not be corrected upon review.

## 2018-11-25 NOTE — Patient Instructions (Signed)
Continue protonix 40 mg daily  GERD information provided  Office visit in 1 year

## 2019-01-14 ENCOUNTER — Telehealth: Payer: Self-pay

## 2019-01-14 MED ORDER — AMITRIPTYLINE HCL 10 MG PO TABS: 30.0000 mg | ORAL_TABLET | Freq: Every day | ORAL | 3 refills | Status: DC

## 2019-01-14 NOTE — Telephone Encounter (Signed)
RX completed. 

## 2019-01-14 NOTE — Telephone Encounter (Signed)
Pt's mother called office requesting refill for Amitriptyline. Spoke to pt, she is taking 30mg  at bedtime. [employer].

## 2019-04-12 IMAGING — DX DG ABDOMEN 1V
1 series · 1 of 1 positions shown · non-contrast
Comparison: None.

CLINICAL DATA: Constipation times 1-2 months. Last bowel movement
yesterday.

EXAM:
ABDOMEN - 1 VIEW

[dg abd 1 view]
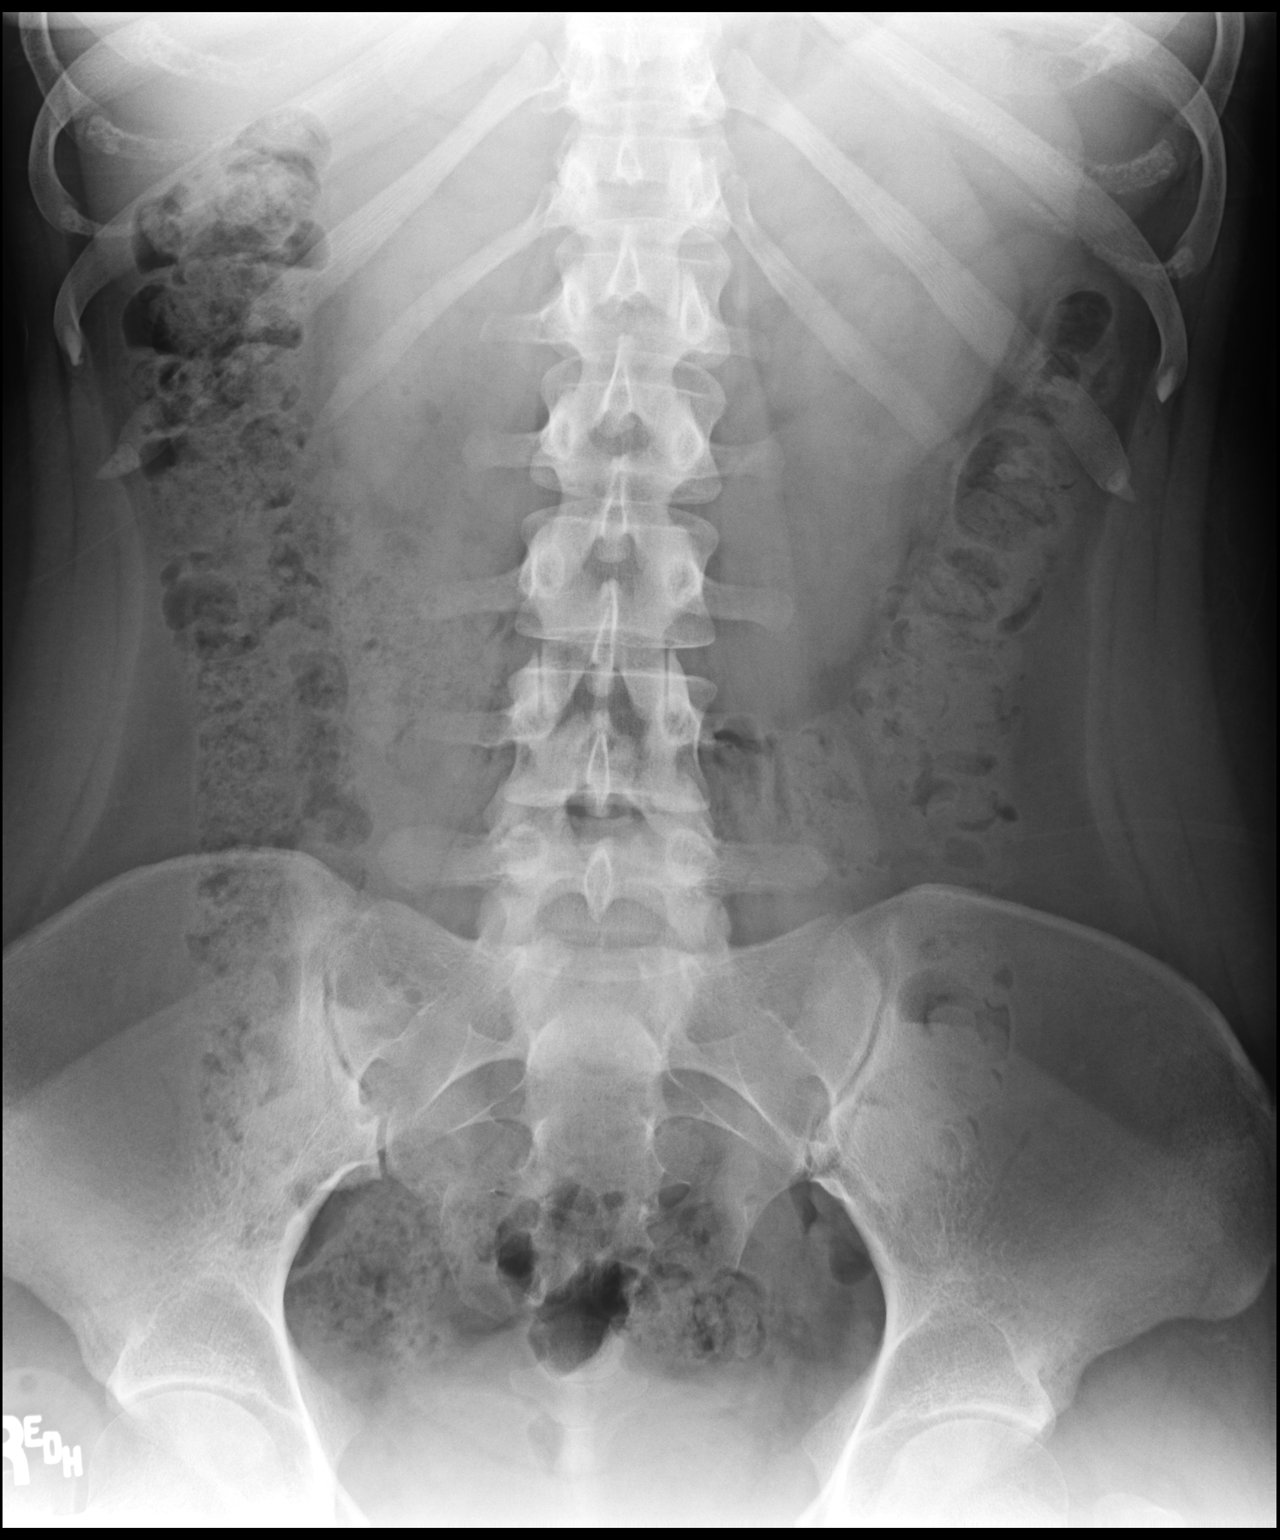

[1 of 1 positions shown; findings below may reference images not displayed]

FINDINGS: Stool throughout the near entirety of large bowel consistent with
constipation. No small bowel dilatation. No free air, organomegaly
nor acute osseous abnormality. No radio-opaque calculi.
IMPRESSION: Stool retention throughout the near entirety of large bowel.

## 2019-06-08 DIAGNOSIS — S93601A Unspecified sprain of right foot, initial encounter: Secondary | ICD-10-CM | POA: Diagnosis not present

## 2019-06-08 DIAGNOSIS — M79671 Pain in right foot: Secondary | ICD-10-CM | POA: Diagnosis not present

## 2019-06-08 DIAGNOSIS — Z68.41 Body mass index (BMI) pediatric, less than 5th percentile for age: Secondary | ICD-10-CM | POA: Diagnosis not present

## 2019-06-11 ENCOUNTER — Ambulatory Visit
Admission: RE | Admit: 2019-06-11 | Discharge: 2019-06-11 | Disposition: A | Discharge status: Home / Self Care | Payer: BLUE CROSS/BLUE SHIELD | Source: Ambulatory Visit | Attending: Family Medicine | Admitting: Family Medicine

## 2019-06-11 ENCOUNTER — Other Ambulatory Visit: Payer: Self-pay | Admitting: Family Medicine

## 2019-06-11 DIAGNOSIS — S93601A Unspecified sprain of right foot, initial encounter: Secondary | ICD-10-CM

## 2019-06-11 DIAGNOSIS — M79671 Pain in right foot: Secondary | ICD-10-CM

## 2019-07-28 ENCOUNTER — Other Ambulatory Visit: Payer: Self-pay

## 2019-07-28 DIAGNOSIS — Z20822 Contact with and (suspected) exposure to covid-19: Secondary | ICD-10-CM

## 2019-07-30 LAB — NOVEL CORONAVIRUS, NAA: SARS-CoV-2, NAA: NOT DETECTED

## 2019-11-25 ENCOUNTER — Encounter: Payer: Self-pay | Admitting: Internal Medicine

## 2020-04-07 ENCOUNTER — Other Ambulatory Visit: Payer: Self-pay | Admitting: Gastroenterology

## 2020-05-24 ENCOUNTER — Other Ambulatory Visit: Payer: Self-pay | Admitting: Gastroenterology

## 2020-05-25 NOTE — Telephone Encounter (Signed)
Patient needs ov for refills. 

## 2020-05-31 ENCOUNTER — Telehealth (INDEPENDENT_AMBULATORY_CARE_PROVIDER_SITE_OTHER): Payer: Medicaid Other | Admitting: Gastroenterology

## 2020-05-31 ENCOUNTER — Telehealth: Payer: Self-pay | Admitting: *Deleted

## 2020-05-31 ENCOUNTER — Encounter: Payer: Self-pay | Admitting: Gastroenterology

## 2020-05-31 DIAGNOSIS — R1013 Epigastric pain: Secondary | ICD-10-CM

## 2020-05-31 DIAGNOSIS — K219 Gastro-esophageal reflux disease without esophagitis: Secondary | ICD-10-CM

## 2020-05-31 DIAGNOSIS — R112 Nausea with vomiting, unspecified: Secondary | ICD-10-CM

## 2020-05-31 NOTE — Patient Instructions (Addendum)
1. Continue amitriptyline 30 mg at bedtime. 2. Continue over-the-counter antacids such as Tums, Rolaids, Pepcid for management of your intermittent reflux symptoms. 3. Make sure your fiber supplement is at least 3 to 4 g daily.  Benefiber and Fiberchoice have chewable options that you can easily reach 3 to 4 g daily.  Most capsules or tablets require 6-8 daily to get 3 to 4 g of fiber daily. 4. If your constipation does not improve with adding more fiber, please reach out to me either by phone or on my chart and we can make some further suggestions. 5. Make sure you are drinking plenty of noncaffeinated fluids, at least 100 ounces daily. 6. Return to the office in 1 year.

## 2020-05-31 NOTE — Telephone Encounter (Signed)
Gina Scott, you are scheduled for a virtual visit with your provider today.  Just as we do with appointments in the office, we must obtain your consent to participate.  Your consent will be active for this visit and any virtual visit you may have with one of our providers in the next 365 days.  If you have a MyChart account, I can also send a copy of this consent to you electronically.  All virtual visits are billed to your insurance company just like a traditional visit in the office.  As this is a virtual visit, video technology does not allow for your provider to perform a traditional examination.  This may limit your provider's ability to fully assess your condition.  If your provider identifies any concerns that need to be evaluated in person or the need to arrange testing such as labs, EKG, etc, we will make arrangements to do so.  Although advances in technology are sophisticated, we cannot ensure that it will always work on either your end or our end.  If the connection with a video visit is poor, we may have to switch to a telephone visit.  With either a video or telephone visit, we are not always able to ensure that we have a secure connection.   I need to obtain your verbal consent now.   Are you willing to proceed with your visit today?

## 2020-05-31 NOTE — Telephone Encounter (Signed)
Pt consented to a virtual visit. 

## 2020-05-31 NOTE — Progress Notes (Signed)
Primary Care Physician:  Elfredia Nevins, MD Primary GI:  Roetta Sessions, MD    Patient Location: Home  Provider Location: [facility] office  Reason for Visit:  Chief Complaint  Patient presents with  . Gastroesophageal Reflux    occ  . Nausea    occ vomiting when takes meds     Persons present on the virtual encounter, with roles: Patient, myself (provider),Martina Booth LPN(updated meds and allergies)  Total time (minutes) spent on medical discussion: 12 minutes  Due to COVID-19, visit was conducted using Mychart Video method.  Visit was requested by patient.  Virtual Visit via Mychart Video  I connected with Gina Scott on 05/31/20 at 10:30 AM EDT by Mychart Video and verified that I am speaking with the correct person using two identifiers.   I discussed the limitations, risks, security and privacy concerns of performing an evaluation and management service by telephone/video and the availability of in person appointments. I also discussed with the patient that there may be a patient responsible charge related to this service. The patient expressed understanding and agreed to proceed.   HPI:    Gina Scott is a 19 y.o. female who presents for virtual visit for follow-up.  She was last seen in February 2020 for GERD.  She has been on pantoprazole 40 mg daily.  Also on amitriptyline 30 mg nightly, previously prescribed by pediatric gastroenterologist, Dr. Adelene Amas.  Working diagnosis of IBS, dyspepsia, GERD.  EGD in July 2019 showed erythematous mucosa in antrum, small bowel biopsies negative, esophageal biopsies consistent with reflux, gastric biopsies with reactive gastropathy but no H. pylori.  H. pylori stool antigen negative in June 2018.  She was treated for H. pylori several years back.  Reviewed available records in epic.  She also saw Dr. Julien Girt, pediatric gastroenterology at [facility] in May 2017.  At that time patient was  reporting heartburn and reflux before vomiting.  For H. pylori at that time.  Suspected lactose intolerance, advised to begin dairy free diet.  Noted some improvement in her symptoms at that time.  She also had a food allergy test May 2017 which tested negative for egg white, cows milk, fish cod, wheat, peanut, soy, egg yolk, corn.  CRP was normal at 5.6.  Patient no longer on PPI.  Using Rolaids or TUMS. Does not use often.  Notes if she misses a dose of amitriptyline, the next morning she wakes up nauseated and/or vomiting.  As long she takes 30 mg at bedtime she does well.  She has been struggling with constipation.  Previously MiraLAX seemed to help but she had to continue taking it which she did not like.  Currently taking fiber capsules 3/day with plenty of water.  She has a bowel movement every day but has to take up to an hour at a time to have a bowel movement.  States is a struggle.  Denies any blood in the stool or melena.  No abdominal pain.  No weight loss, unintentional.       Current Outpatient Medications  Medication Sig Dispense Refill  . albuterol (PROVENTIL HFA;VENTOLIN HFA) 108 (90 Base) MCG/ACT inhaler Inhale 2 puffs into the lungs every 4 (four) hours as needed for wheezing or shortness of breath.     Marland Kitchen amitriptyline (ELAVIL) 10 MG tablet TAKE 3 TABLETS BY MOUTH ONCE AT BEDTIME. 90 tablet 0  . Ca Carbonate-Mag Hydroxide (ROLAIDS PO) Take 1 tablet by  mouth as needed.    . Coenzyme Q10 (COQ-10) 100 MG CAPS Take 1 capsule by mouth 2 (two) times daily. 60 each 1   No current facility-administered medications for this visit.    Past Medical History:  Diagnosis Date  . ADHD (attention deficit hyperactivity disorder)   . Anemia   . Asthma   . Gastroparesis   . GERD (gastroesophageal reflux disease)   . H. pylori infection    PMH:  . Headache   . Heart murmur    as an infant  . Obesity (BMI 30-39.9)   . Panic attacks     Past Surgical History:  Procedure Laterality  Date  . ESOPHAGOGASTRODUODENOSCOPY (EGD) WITH PROPOFOL N/A 05/21/2017   Dr. Cloretta Ned: Erythematous mucosa in the antrum, gastric biopsies with active gastropathy but no H. pylori, small bowel biopsies negative, esophageal biopsies consistent with reflux.  . TYMPANOSTOMY TUBE PLACEMENT      Family History  Problem Relation Age of Onset  . Stroke Maternal Grandmother   . Hypertension Mother   . Multiple sclerosis Mother   . Irritable bowel syndrome Mother   . Other Mother        gastroparesis  . Inflammatory bowel disease Neg Hx   . Colon cancer Neg Hx   . Liver cancer Neg Hx   . Cirrhosis Neg Hx     Social History   Socioeconomic History  . Marital status: Single    Spouse name: Not on file  . Number of children: Not on file  . Years of education: Not on file  . Highest education level: Not on file  Occupational History  . Not on file  Tobacco Use  . Smoking status: Never Smoker  . Smokeless tobacco: Never Used  Vaping Use  . Vaping Use: Never used  Substance and Sexual Activity  . Alcohol use: No  . Drug use: No  . Sexual activity: Never  Other Topics Concern  . Not on file  Social History Narrative  . Not on file   Social Determinants of Health   Financial Resource Strain:   . Difficulty of Paying Living Expenses:   Food Insecurity:   . Worried About Programme researcher, broadcasting/film/video in the Last Year:   . Barista in the Last Year:   Transportation Needs:   . Freight forwarder (Medical):   Marland Kitchen Lack of Transportation (Non-Medical):   Physical Activity:   . Days of Exercise per Week:   . Minutes of Exercise per Session:   Stress:   . Feeling of Stress :   Social Connections:   . Frequency of Communication with Friends and Family:   . Frequency of Social Gatherings with Friends and Family:   . Attends Religious Services:   . Active Member of Clubs or Organizations:   . Attends Banker Meetings:   Marland Kitchen Marital Status:   Intimate Partner Violence:   .  Fear of Current or Ex-Partner:   . Emotionally Abused:   Marland Kitchen Physically Abused:   . Sexually Abused:       ROS:  General: Negative for anorexia, weight loss, fever, chills, fatigue, weakness. Eyes: Negative for vision changes.  ENT: Negative for hoarseness, difficulty swallowing , nasal congestion. CV: Negative for chest pain, angina, palpitations, dyspnea on exertion, peripheral edema.  Respiratory: Negative for dyspnea at rest, dyspnea on exertion, cough, sputum, wheezing.  GI: See history of present illness. GU:  Negative for dysuria, hematuria, urinary incontinence, urinary frequency,  nocturnal urination.  MS: Negative for joint pain, low back pain.  Derm: Negative for rash or itching.  Neuro: Negative for weakness, abnormal sensation, seizure, frequent headaches, memory loss, confusion.  Psych: Negative for anxiety, depression, suicidal ideation, hallucinations.  Endo: Negative for unusual weight change.  Heme: Negative for bruising or bleeding. Allergy: Negative for rash or hives.   Observations/Objective: Pleasant, well-nourished, well-developed female in no acute distress.  Otherwise exam unavailable.  Assessment and Plan: Pleasant 19 year old female with history of GERD, dyspepsia, IBS presenting for follow-up.  GERD symptoms managed with over-the-counter antacids.  Only has to take Tums or Rolaids when she eats certain foods.  Dyspepsia and nausea well managed with amitriptyline 30 mg at bedtime which she has been on for several years, initially started by her pediatric gastroenterologist.  If she misses a dose she has recurrent vomiting.  Biggest issue right now is constipation.  She is taking fiber capsules 3 daily, likely insignificant amount of supplemental fiber.  We discussed options of increasing her daily fiber supplement versus adding MiraLAX versus prescription options.  She would like to pursue increasing her fiber.  We will reach out to me on my chart if she continues  to have problems.  Otherwise we will see her back in a year.  Follow Up Instructions:    I discussed the assessment and treatment plan with the patient. The patient was provided an opportunity to ask questions and all were answered. The patient agreed with the plan and demonstrated an understanding of the instructions. AVS mailed to patient's home address.   The patient was advised to call back or seek an in-person evaluation if the symptoms worsen or if the condition fails to improve as anticipated.  I provided 12 minutes of virtual face-to-face time during this encounter.   Tana Coast, PA-C

## 2020-06-01 ENCOUNTER — Encounter: Payer: Self-pay | Admitting: Internal Medicine

## 2020-06-08 ENCOUNTER — Other Ambulatory Visit: Payer: Medicaid Other

## 2020-06-08 ENCOUNTER — Other Ambulatory Visit: Payer: Self-pay | Admitting: Critical Care Medicine

## 2020-06-08 DIAGNOSIS — Z20822 Contact with and (suspected) exposure to covid-19: Secondary | ICD-10-CM | POA: Diagnosis not present

## 2020-06-10 LAB — NOVEL CORONAVIRUS, NAA: SARS-CoV-2, NAA: DETECTED — AB

## 2020-06-10 LAB — SARS-COV-2, NAA 2 DAY TAT

## 2020-06-11 ENCOUNTER — Telehealth: Payer: Self-pay | Admitting: Adult Health

## 2020-06-11 NOTE — Telephone Encounter (Signed)
Called and LMOM regarding monoclonal antibody treatment for COVID 19 given to those who are at risk for complications and/or hospitalization of the virus.  Patient meets criteria based on: unsure, calling to discuss further  Call back number given: [phone]  My chart message: sent  Lillard Anes, NP

## 2020-10-07 DIAGNOSIS — Z23 Encounter for immunization: Secondary | ICD-10-CM | POA: Diagnosis not present

## 2020-11-28 ENCOUNTER — Other Ambulatory Visit: Payer: Self-pay | Admitting: Gastroenterology

## 2021-03-02 DIAGNOSIS — J301 Allergic rhinitis due to pollen: Secondary | ICD-10-CM | POA: Diagnosis not present

## 2021-03-02 DIAGNOSIS — Z681 Body mass index (BMI) 19 or less, adult: Secondary | ICD-10-CM | POA: Diagnosis not present

## 2021-03-02 DIAGNOSIS — J029 Acute pharyngitis, unspecified: Secondary | ICD-10-CM | POA: Diagnosis not present

## 2021-05-22 ENCOUNTER — Encounter: Payer: Self-pay | Admitting: Internal Medicine

## 2021-06-07 DIAGNOSIS — M546 Pain in thoracic spine: Secondary | ICD-10-CM | POA: Diagnosis not present

## 2021-06-07 DIAGNOSIS — M9902 Segmental and somatic dysfunction of thoracic region: Secondary | ICD-10-CM | POA: Diagnosis not present

## 2021-06-07 DIAGNOSIS — M542 Cervicalgia: Secondary | ICD-10-CM | POA: Diagnosis not present

## 2021-06-07 DIAGNOSIS — M9901 Segmental and somatic dysfunction of cervical region: Secondary | ICD-10-CM | POA: Diagnosis not present

## 2021-06-12 DIAGNOSIS — M9902 Segmental and somatic dysfunction of thoracic region: Secondary | ICD-10-CM | POA: Diagnosis not present

## 2021-06-12 DIAGNOSIS — M542 Cervicalgia: Secondary | ICD-10-CM | POA: Diagnosis not present

## 2021-06-12 DIAGNOSIS — M546 Pain in thoracic spine: Secondary | ICD-10-CM | POA: Diagnosis not present

## 2021-06-12 DIAGNOSIS — M9901 Segmental and somatic dysfunction of cervical region: Secondary | ICD-10-CM | POA: Diagnosis not present

## 2021-06-13 ENCOUNTER — Other Ambulatory Visit: Payer: Self-pay | Admitting: Gastroenterology

## 2021-06-19 DIAGNOSIS — M546 Pain in thoracic spine: Secondary | ICD-10-CM | POA: Diagnosis not present

## 2021-06-19 DIAGNOSIS — M542 Cervicalgia: Secondary | ICD-10-CM | POA: Diagnosis not present

## 2021-06-19 DIAGNOSIS — M9901 Segmental and somatic dysfunction of cervical region: Secondary | ICD-10-CM | POA: Diagnosis not present

## 2021-06-19 DIAGNOSIS — M9902 Segmental and somatic dysfunction of thoracic region: Secondary | ICD-10-CM | POA: Diagnosis not present

## 2021-06-27 DIAGNOSIS — M542 Cervicalgia: Secondary | ICD-10-CM | POA: Diagnosis not present

## 2021-06-27 DIAGNOSIS — M9902 Segmental and somatic dysfunction of thoracic region: Secondary | ICD-10-CM | POA: Diagnosis not present

## 2021-06-27 DIAGNOSIS — M9901 Segmental and somatic dysfunction of cervical region: Secondary | ICD-10-CM | POA: Diagnosis not present

## 2021-06-27 DIAGNOSIS — M546 Pain in thoracic spine: Secondary | ICD-10-CM | POA: Diagnosis not present

## 2021-07-03 DIAGNOSIS — M546 Pain in thoracic spine: Secondary | ICD-10-CM | POA: Diagnosis not present

## 2021-07-03 DIAGNOSIS — M9901 Segmental and somatic dysfunction of cervical region: Secondary | ICD-10-CM | POA: Diagnosis not present

## 2021-07-03 DIAGNOSIS — M542 Cervicalgia: Secondary | ICD-10-CM | POA: Diagnosis not present

## 2021-07-03 DIAGNOSIS — M9902 Segmental and somatic dysfunction of thoracic region: Secondary | ICD-10-CM | POA: Diagnosis not present

## 2021-07-10 DIAGNOSIS — M9902 Segmental and somatic dysfunction of thoracic region: Secondary | ICD-10-CM | POA: Diagnosis not present

## 2021-07-10 DIAGNOSIS — M546 Pain in thoracic spine: Secondary | ICD-10-CM | POA: Diagnosis not present

## 2021-07-10 DIAGNOSIS — M9901 Segmental and somatic dysfunction of cervical region: Secondary | ICD-10-CM | POA: Diagnosis not present

## 2021-07-10 DIAGNOSIS — M542 Cervicalgia: Secondary | ICD-10-CM | POA: Diagnosis not present

## 2021-07-17 DIAGNOSIS — M9901 Segmental and somatic dysfunction of cervical region: Secondary | ICD-10-CM | POA: Diagnosis not present

## 2021-07-17 DIAGNOSIS — M9902 Segmental and somatic dysfunction of thoracic region: Secondary | ICD-10-CM | POA: Diagnosis not present

## 2021-07-17 DIAGNOSIS — M546 Pain in thoracic spine: Secondary | ICD-10-CM | POA: Diagnosis not present

## 2021-07-17 DIAGNOSIS — M542 Cervicalgia: Secondary | ICD-10-CM | POA: Diagnosis not present

## 2021-08-07 DIAGNOSIS — M9901 Segmental and somatic dysfunction of cervical region: Secondary | ICD-10-CM | POA: Diagnosis not present

## 2021-08-07 DIAGNOSIS — M542 Cervicalgia: Secondary | ICD-10-CM | POA: Diagnosis not present

## 2021-08-07 DIAGNOSIS — M546 Pain in thoracic spine: Secondary | ICD-10-CM | POA: Diagnosis not present

## 2021-08-07 DIAGNOSIS — M9902 Segmental and somatic dysfunction of thoracic region: Secondary | ICD-10-CM | POA: Diagnosis not present

## 2021-08-21 DIAGNOSIS — M546 Pain in thoracic spine: Secondary | ICD-10-CM | POA: Diagnosis not present

## 2021-08-21 DIAGNOSIS — M9902 Segmental and somatic dysfunction of thoracic region: Secondary | ICD-10-CM | POA: Diagnosis not present

## 2021-08-21 DIAGNOSIS — M542 Cervicalgia: Secondary | ICD-10-CM | POA: Diagnosis not present

## 2021-08-21 DIAGNOSIS — M9901 Segmental and somatic dysfunction of cervical region: Secondary | ICD-10-CM | POA: Diagnosis not present

## 2021-08-30 ENCOUNTER — Encounter: Payer: Self-pay | Admitting: Internal Medicine

## 2021-08-31 ENCOUNTER — Telehealth: Payer: Self-pay

## 2021-08-31 NOTE — Telephone Encounter (Signed)
Noted.  No action needed

## 2021-08-31 NOTE — Telephone Encounter (Signed)
Documentation from [employer] regarding this pt's Amitriptylin tab on your desk.

## 2021-09-18 DIAGNOSIS — M9902 Segmental and somatic dysfunction of thoracic region: Secondary | ICD-10-CM | POA: Diagnosis not present

## 2021-09-18 DIAGNOSIS — M546 Pain in thoracic spine: Secondary | ICD-10-CM | POA: Diagnosis not present

## 2021-09-18 DIAGNOSIS — M542 Cervicalgia: Secondary | ICD-10-CM | POA: Diagnosis not present

## 2021-09-18 DIAGNOSIS — M9901 Segmental and somatic dysfunction of cervical region: Secondary | ICD-10-CM | POA: Diagnosis not present

## 2021-10-10 ENCOUNTER — Other Ambulatory Visit: Payer: Self-pay

## 2021-10-10 ENCOUNTER — Encounter: Payer: Self-pay | Admitting: Gastroenterology

## 2021-10-10 ENCOUNTER — Ambulatory Visit: Payer: BC Managed Care – PPO | Admitting: Gastroenterology

## 2021-10-10 ENCOUNTER — Telehealth: Payer: Self-pay | Admitting: *Deleted

## 2021-10-10 VITALS — BP 139/77 | HR 95 | Temp 98.2°F | Ht 64.0 in | Wt 205.2 lb

## 2021-10-10 DIAGNOSIS — R112 Nausea with vomiting, unspecified: Secondary | ICD-10-CM

## 2021-10-10 DIAGNOSIS — R1310 Dysphagia, unspecified: Secondary | ICD-10-CM

## 2021-10-10 DIAGNOSIS — K219 Gastro-esophageal reflux disease without esophagitis: Secondary | ICD-10-CM | POA: Diagnosis not present

## 2021-10-10 MED ORDER — ONDANSETRON HCL 4 MG PO TABS: 4.0000 mg | ORAL_TABLET | Freq: Three times a day (TID) | ORAL | 1 refills | Status: DC | PRN

## 2021-10-10 MED ORDER — PANTOPRAZOLE SODIUM 40 MG PO TBEC: 40.0000 mg | DELAYED_RELEASE_TABLET | Freq: Every day | ORAL | 3 refills | Status: AC

## 2021-10-10 NOTE — Patient Instructions (Signed)
I have sent in pantoprazole (Protonix) to take once each morning on an empty stomach, at least 30 minutes before eating.  For nausea, I have sent in Zofran that dissolves under your tongue. You can take every 8 hours as needed.  Let's leave amitriptyline at the dosage that it is. Please don't change dosage, as this needs to be monitored if any increasing.   We are arranging a gastric emptying study.  We are also arranging an upper endoscopy with dilation by Dr. Jena Gauss in the near future.   Further recommendations to follow!  It was a pleasure to see you today. I want to create trusting relationships with patients to provide genuine, compassionate, and quality care. I value your feedback. If you receive a survey regarding your visit,  I greatly appreciate you taking time to fill this out.   Gelene Mink, PhD, ANP-BC [facility]

## 2021-10-10 NOTE — Telephone Encounter (Signed)
PA approved via AIM. Auth# Order ID: [id number] , DOS 10/10/2021 - 11/08/2021

## 2021-10-10 NOTE — Progress Notes (Signed)
Referring Provider: Elfredia Nevins, MD Primary Care Physician:  Elfredia Nevins, MD Primary GI: Dr. Jena Gauss  Chief Complaint  Patient presents with   Nausea    Still having nausea, vomiting at times    HPI:   Gina Scott is a 20 y.o. female presenting today with a history of chronic GERD, dyspepsia, IBS. EGD in July 2019 showed erythematous mucosa in antrum, small bowel biopsies negative, esophageal biopsies consistent with reflux, gastric biopsies with reactive gastropathy but no H. pylori.  H. pylori stool antigen negative in June 2018.  She was treated for H. pylori several years back. She has been on amitriptyline at bedtime chronically, which was started by pediatric gastroenterologist.    Has been titrating up amitriptyline on own. Now taking 60 mg in evening. Nausea started worsening towards middle of the year. Intermittent vomiting. Will take ginger to help. Not much of an appetite. Has a slow burn chronically in abdomen. States food getting stuck in throat and has to vomit to get this out. Has been on pantoprazole and Nexium in the past. No prior GES. Mom with gastroparesis. Uses mom's phenergan for emergency. No PPI currently.   No weight loss. She has actually gained weight since last seen.   Past Medical History:  Diagnosis Date   ADHD (attention deficit hyperactivity disorder)    Anemia    Asthma    Gastroparesis    GERD (gastroesophageal reflux disease)    H. pylori infection    PMH:   Headache    Heart murmur    as an infant   Obesity (BMI 30-39.9)    Panic attacks     Past Surgical History:  Procedure Laterality Date   ESOPHAGOGASTRODUODENOSCOPY (EGD) WITH PROPOFOL N/A 05/21/2017   Dr. Cloretta Ned: Erythematous mucosa in the antrum, gastric biopsies with active gastropathy but no H. pylori, small bowel biopsies negative, esophageal biopsies consistent with reflux.   TYMPANOSTOMY TUBE PLACEMENT      Current Outpatient Medications  Medication Sig  Dispense Refill   albuterol (PROVENTIL HFA;VENTOLIN HFA) 108 (90 Base) MCG/ACT inhaler Inhale 2 puffs into the lungs every 4 (four) hours as needed for wheezing or shortness of breath.      amitriptyline (ELAVIL) 10 MG tablet TAKE 3 TABLETS BY MOUTH ONCE AT BEDTIME. (Patient taking differently: 6 tablets a day) 90 tablet 1   Ca Carbonate-Mag Hydroxide (ROLAIDS PO) Take 1 tablet by mouth as needed. (Patient not taking: Reported on 10/10/2021)     Coenzyme Q10 (COQ-10) 100 MG CAPS Take 1 capsule by mouth 2 (two) times daily. (Patient not taking: Reported on 10/10/2021) 60 each 1   No current facility-administered medications for this visit.    Allergies as of 10/10/2021 - Review Complete 10/10/2021  Allergen Reaction Noted   Dairy aid [tilactase]  12/14/2016    Family History  Problem Relation Age of Onset   Stroke Maternal Grandmother    Hypertension Mother    Multiple sclerosis Mother    Irritable bowel syndrome Mother    Other Mother        gastroparesis   Inflammatory bowel disease Neg Hx    Colon cancer Neg Hx    Liver cancer Neg Hx    Cirrhosis Neg Hx     Social History   Socioeconomic History   Marital status: Single    Spouse name: Not on file   Number of children: Not on file   Years of education: Not on  file   Highest education level: Not on file  Occupational History   Not on file  Tobacco Use   Smoking status: Never   Smokeless tobacco: Never  Vaping Use   Vaping Use: Never used  Substance and Sexual Activity   Alcohol use: No   Drug use: No   Sexual activity: Never  Other Topics Concern   Not on file  Social History Narrative   Not on file   Social Determinants of Health   Financial Resource Strain: Not on file  Food Insecurity: Not on file  Transportation Needs: Not on file  Physical Activity: Not on file  Stress: Not on file  Social Connections: Not on file    Review of Systems: Gen: Denies fever, chills, anorexia. Denies fatigue, weakness,  weight loss.  CV: Denies chest pain, palpitations, syncope, peripheral edema, and claudication. Resp: Denies dyspnea at rest, cough, wheezing, coughing up blood, and pleurisy. GI: see HPI Derm: Denies rash, itching, dry skin Psych: Denies depression, anxiety, memory loss, confusion. No homicidal or suicidal ideation.  Heme: Denies bruising, bleeding, and enlarged lymph nodes.  Physical Exam: BP 139/77    Pulse 95    Temp 98.2 F (36.8 C)    Ht 5\' 4"  (1.626 m)    Wt 205 lb 3.2 oz (93.1 kg)    LMP 09/20/2021    BMI 35.22 kg/m  General:   Alert and oriented. No distress noted. Pleasant and cooperative.  Head:  Normocephalic and atraumatic. Eyes:  Conjuctiva clear without scleral icterus. Mouth:  mask in place Cardiac: S1 S2 present without murmurs Lungs: Clear bilaterally Abdomen:  +BS, soft, mildly TTP diffusely and non-distended. No rebound or guarding. No HSM or masses noted. Msk:  Symmetrical without gross deformities. Normal posture. Extremities:  Without edema. Neurologic:  Alert and  oriented x4 Psych:  Alert and cooperative. Normal mood and affect.  ASSESSMENT: Gina Scott is a 20 y.o. female presenting today with a history of chronic GERD, dyspepsia, IBS, chronic N/V. Previously seen by pediatric gastroenterologist years ago, with EGD in July 2019 showed erythematous mucosa in antrum, small bowel biopsies negative, esophageal biopsies consistent with reflux, gastric biopsies with reactive gastropathy but no H. pylori. Now presenting with worsening N/V and dysphagia.   N/V: multifactorial in setting of uncontrolled GERD, suspected gastroparesis but no formal GES on file. Amitriptyline had been started by pediatric GI, and she had done well with this in the past.  However, she has titrated this up on her own from 30 mg to 60 mg. I have asked her to refrain from adjusting any further on own. We will add pantoprazole daily, Zofran prn, and pursue GES. EGD/dilation also to be  scheduled.  Dysphagia: to solid food. Uncontrolled GERD contributing. EGD/dilation to be arranged.    PLAN:  Start pantoprazole daily  Proceed with upper endoscopy/dilation by Dr. August 2019 in near future using Propofol: the risks, benefits, and alternatives have been discussed with the patient in detail. The patient states understanding and desires to proceed.   GES to be arranged  Clear liquids after noon day prior to EGD in preparation  Continue amitriptyline each evening: patient cautioned not to adjust dosage on own.   Further recommendations to follow     Jena Gauss, PhD, ANP-BC [facility]

## 2021-10-11 ENCOUNTER — Telehealth: Payer: Self-pay | Admitting: Internal Medicine

## 2021-10-11 NOTE — Telephone Encounter (Signed)
PLEASE CALL PATIENT , SHE NEEDS A CPT CODE FOR HER TEST

## 2021-10-11 NOTE — Telephone Encounter (Signed)
Spoke to pt, gave her CPT code for GES. Informed her PA was approved for GES.

## 2021-10-11 NOTE — Telephone Encounter (Signed)
Tried to call pt, LMOVM for return call. 

## 2021-10-13 ENCOUNTER — Other Ambulatory Visit: Payer: Self-pay

## 2021-10-13 ENCOUNTER — Encounter (HOSPITAL_COMMUNITY)
Admission: RE | Admit: 2021-10-13 | Discharge: 2021-10-13 | Disposition: A | Discharge status: Home / Self Care | Payer: BC Managed Care – PPO | Source: Ambulatory Visit | Attending: Gastroenterology | Admitting: Gastroenterology

## 2021-10-13 DIAGNOSIS — R112 Nausea with vomiting, unspecified: Secondary | ICD-10-CM | POA: Diagnosis not present

## 2021-10-13 DIAGNOSIS — R1110 Vomiting, unspecified: Secondary | ICD-10-CM | POA: Diagnosis not present

## 2021-10-13 MED ORDER — TECHNETIUM TC 99M SULFUR COLLOID
2.0000 | Freq: Once | INTRAVENOUS | Status: AC | PRN
  Administered 2021-10-13: 09:00:00 2.11 via ORAL

## 2021-10-17 DIAGNOSIS — M546 Pain in thoracic spine: Secondary | ICD-10-CM | POA: Diagnosis not present

## 2021-10-17 DIAGNOSIS — M542 Cervicalgia: Secondary | ICD-10-CM | POA: Diagnosis not present

## 2021-10-17 DIAGNOSIS — M9902 Segmental and somatic dysfunction of thoracic region: Secondary | ICD-10-CM | POA: Diagnosis not present

## 2021-10-17 DIAGNOSIS — M9901 Segmental and somatic dysfunction of cervical region: Secondary | ICD-10-CM | POA: Diagnosis not present

## 2021-10-18 ENCOUNTER — Telehealth: Payer: Self-pay | Admitting: Internal Medicine

## 2021-10-18 NOTE — Telephone Encounter (Signed)
Pt returning call. [phone]

## 2021-10-18 NOTE — Telephone Encounter (Signed)
See result note.  

## 2021-10-24 ENCOUNTER — Telehealth: Payer: Self-pay | Admitting: *Deleted

## 2021-10-24 ENCOUNTER — Other Ambulatory Visit: Payer: Self-pay | Admitting: Gastroenterology

## 2021-10-24 ENCOUNTER — Ambulatory Visit: Payer: Medicaid Other | Admitting: Gastroenterology

## 2021-10-24 NOTE — Telephone Encounter (Signed)
Received refill request for Amitriptyline 10mg .

## 2021-10-25 NOTE — Telephone Encounter (Signed)
Pt called checking on the status of this refill.

## 2021-10-26 NOTE — Telephone Encounter (Signed)
This was refilled today

## 2021-10-27 ENCOUNTER — Other Ambulatory Visit (HOSPITAL_COMMUNITY)
Admission: RE | Admit: 2021-10-27 | Discharge: 2021-10-27 | Disposition: A | Discharge status: Home / Self Care | Payer: BC Managed Care – PPO | Source: Ambulatory Visit | Attending: Internal Medicine | Admitting: Internal Medicine

## 2021-10-27 DIAGNOSIS — R112 Nausea with vomiting, unspecified: Secondary | ICD-10-CM | POA: Insufficient documentation

## 2021-10-27 DIAGNOSIS — R1310 Dysphagia, unspecified: Secondary | ICD-10-CM | POA: Diagnosis not present

## 2021-10-27 LAB — PREGNANCY, URINE: Preg Test, Ur: NEGATIVE

## 2021-10-30 ENCOUNTER — Ambulatory Visit (HOSPITAL_COMMUNITY)
Admission: RE | Admit: 2021-10-30 | Payer: BC Managed Care – PPO | Source: Home / Self Care | Admitting: Internal Medicine

## 2021-10-30 ENCOUNTER — Encounter (HOSPITAL_COMMUNITY): Admission: RE | Payer: Self-pay | Source: Home / Self Care

## 2021-10-30 ENCOUNTER — Telehealth: Payer: Self-pay

## 2021-10-30 SURGERY — ESOPHAGOGASTRODUODENOSCOPY (EGD) WITH PROPOFOL
Anesthesia: Monitor Anesthesia Care

## 2021-10-30 NOTE — Telephone Encounter (Signed)
-----   Message from Nobie Putnam sent at 10/30/2021  8:26 AM EST ----- Hey,  This patient was on the schedule for today, she called and said she was going have to pay a large amount of money and she needed to talk with her insurance and then reschedule.  I pulled her into the depot and advised her to call your office to reschedule.  Thanks,  Eber Jones

## 2021-10-30 NOTE — Telephone Encounter (Signed)
Endo scheduler advised pt cancelled procedure for today. She told pt to call office to reschedule.

## 2021-10-31 ENCOUNTER — Telehealth: Payer: Self-pay | Admitting: Internal Medicine

## 2021-10-31 NOTE — Telephone Encounter (Signed)
Tried to call pt, LMOVM for return call. 

## 2021-10-31 NOTE — Telephone Encounter (Signed)
Pt called to reschedule her EGD that was scheduled for this Monday 10/30/2021.  [phone]

## 2021-10-31 NOTE — Telephone Encounter (Signed)
Spoke to pt, offered procedure 11/06/21. She wants to wait about another month to do EGD/DIL. She is unable to afford procedure at this time. Advised her we will call back to schedule procedure when more dates are available.

## 2021-11-08 NOTE — Telephone Encounter (Signed)
LMOVM for pt 

## 2021-11-09 NOTE — Telephone Encounter (Signed)
Called pt, offered to reschedule EGD/DIL in February w/Dr. Abbey Chatters. She prefers to wait until March for procedure d/t insurance/cost. Advised her we will call back when March schedule is available.

## 2021-11-15 DIAGNOSIS — M9902 Segmental and somatic dysfunction of thoracic region: Secondary | ICD-10-CM | POA: Diagnosis not present

## 2021-11-15 DIAGNOSIS — M546 Pain in thoracic spine: Secondary | ICD-10-CM | POA: Diagnosis not present

## 2021-11-15 DIAGNOSIS — M542 Cervicalgia: Secondary | ICD-10-CM | POA: Diagnosis not present

## 2021-11-15 DIAGNOSIS — M9901 Segmental and somatic dysfunction of cervical region: Secondary | ICD-10-CM | POA: Diagnosis not present

## 2021-12-13 DIAGNOSIS — M546 Pain in thoracic spine: Secondary | ICD-10-CM | POA: Diagnosis not present

## 2021-12-13 DIAGNOSIS — M9902 Segmental and somatic dysfunction of thoracic region: Secondary | ICD-10-CM | POA: Diagnosis not present

## 2021-12-13 DIAGNOSIS — M542 Cervicalgia: Secondary | ICD-10-CM | POA: Diagnosis not present

## 2021-12-13 DIAGNOSIS — M9901 Segmental and somatic dysfunction of cervical region: Secondary | ICD-10-CM | POA: Diagnosis not present

## 2021-12-19 ENCOUNTER — Telehealth: Payer: Self-pay

## 2021-12-19 NOTE — Telephone Encounter (Signed)
Tried to call pt to schedule EGD/-/+DIL w/ Propofol ASA 2 w/Dr. Rourk, LMOVM for return call. 

## 2021-12-20 NOTE — Telephone Encounter (Signed)
LMVOM to call back. Letter mailed. ?

## 2022-01-08 DIAGNOSIS — M9901 Segmental and somatic dysfunction of cervical region: Secondary | ICD-10-CM | POA: Diagnosis not present

## 2022-01-08 DIAGNOSIS — M546 Pain in thoracic spine: Secondary | ICD-10-CM | POA: Diagnosis not present

## 2022-01-08 DIAGNOSIS — M9902 Segmental and somatic dysfunction of thoracic region: Secondary | ICD-10-CM | POA: Diagnosis not present

## 2022-01-08 DIAGNOSIS — M542 Cervicalgia: Secondary | ICD-10-CM | POA: Diagnosis not present

## 2022-02-05 DIAGNOSIS — M546 Pain in thoracic spine: Secondary | ICD-10-CM | POA: Diagnosis not present

## 2022-02-05 DIAGNOSIS — M9902 Segmental and somatic dysfunction of thoracic region: Secondary | ICD-10-CM | POA: Diagnosis not present

## 2022-02-05 DIAGNOSIS — M542 Cervicalgia: Secondary | ICD-10-CM | POA: Diagnosis not present

## 2022-02-05 DIAGNOSIS — M9901 Segmental and somatic dysfunction of cervical region: Secondary | ICD-10-CM | POA: Diagnosis not present

## 2022-03-05 DIAGNOSIS — M542 Cervicalgia: Secondary | ICD-10-CM | POA: Diagnosis not present

## 2022-03-05 DIAGNOSIS — M546 Pain in thoracic spine: Secondary | ICD-10-CM | POA: Diagnosis not present

## 2022-03-05 DIAGNOSIS — M9902 Segmental and somatic dysfunction of thoracic region: Secondary | ICD-10-CM | POA: Diagnosis not present

## 2022-03-05 DIAGNOSIS — M9901 Segmental and somatic dysfunction of cervical region: Secondary | ICD-10-CM | POA: Diagnosis not present

## 2022-03-30 DIAGNOSIS — M9901 Segmental and somatic dysfunction of cervical region: Secondary | ICD-10-CM | POA: Diagnosis not present

## 2022-03-30 DIAGNOSIS — M542 Cervicalgia: Secondary | ICD-10-CM | POA: Diagnosis not present

## 2022-03-30 DIAGNOSIS — M546 Pain in thoracic spine: Secondary | ICD-10-CM | POA: Diagnosis not present

## 2022-03-30 DIAGNOSIS — M9902 Segmental and somatic dysfunction of thoracic region: Secondary | ICD-10-CM | POA: Diagnosis not present

## 2022-04-27 DIAGNOSIS — M9902 Segmental and somatic dysfunction of thoracic region: Secondary | ICD-10-CM | POA: Diagnosis not present

## 2022-04-27 DIAGNOSIS — M546 Pain in thoracic spine: Secondary | ICD-10-CM | POA: Diagnosis not present

## 2022-04-27 DIAGNOSIS — M9901 Segmental and somatic dysfunction of cervical region: Secondary | ICD-10-CM | POA: Diagnosis not present

## 2022-04-27 DIAGNOSIS — M542 Cervicalgia: Secondary | ICD-10-CM | POA: Diagnosis not present

## 2022-05-17 ENCOUNTER — Encounter: Payer: Self-pay | Admitting: Emergency Medicine

## 2022-05-17 ENCOUNTER — Ambulatory Visit
Admission: EM | Admit: 2022-05-17 | Discharge: 2022-05-17 | Disposition: A | Discharge status: Home / Self Care | Payer: BC Managed Care – PPO | Attending: Nurse Practitioner | Admitting: Nurse Practitioner

## 2022-05-17 DIAGNOSIS — R112 Nausea with vomiting, unspecified: Secondary | ICD-10-CM | POA: Diagnosis not present

## 2022-05-17 DIAGNOSIS — G8929 Other chronic pain: Secondary | ICD-10-CM

## 2022-05-17 DIAGNOSIS — R1013 Epigastric pain: Secondary | ICD-10-CM | POA: Diagnosis not present

## 2022-05-17 MED ORDER — PROMETHAZINE HCL 12.5 MG PO TABS: 12.5000 mg | ORAL_TABLET | Freq: Three times a day (TID) | ORAL | 0 refills | Status: DC | PRN

## 2022-05-17 MED ORDER — ALUM & MAG HYDROXIDE-SIMETH 200-200-20 MG/5ML PO SUSP
30.0000 mL | Freq: Once | ORAL | Status: AC
  Administered 2022-05-17: 30 mL via ORAL

## 2022-05-17 NOTE — ED Triage Notes (Signed)
Nausea this morning.  Has taken medication to help with no relief.  Needs a note to return to work.

## 2022-05-17 NOTE — ED Provider Notes (Signed)
[facility]    CSN: [id number] Arrival date & time: 05/17/22  1246      History   Chief Complaint No chief complaint on file.   HPI Gina Scott is a 21 y.o. female.   Patient presents today for nausea and bilious vomiting.  She also endorses some upper abdominal pain that has been worsening since this morning.  She reports baseline nausea and upper abdominal pain and it is slightly worse than normal.  She vomited while at work and does not usually do this.  She reports yesterday was her birthday and she drank a few alcoholic beverages.  She has taken Zofran which did not help; reports she "threw it up."  Denies fever, body aches, and chills.  No blood in vomit or stool.  Has had some diarrhea since this morning.  She denies heartburn, new rash, dysuria/urinary frequency, hematuria, and frequent NSAID use.      Past Medical History:  Diagnosis Date   ADHD (attention deficit hyperactivity disorder)    Anemia    Asthma    Gastroparesis    GERD (gastroesophageal reflux disease)    H. pylori infection    PMH:   Headache    Heart murmur    as an infant   Hypersomnia    per patient   Obesity (BMI 30-39.9)    Panic attacks     Patient Active Problem List   Diagnosis Date Noted   Dysphagia 10/10/2021   GERD (gastroesophageal reflux disease) 08/05/2018   Dyspepsia 08/05/2018   Nausea with vomiting 08/05/2018    Past Surgical History:  Procedure Laterality Date   ESOPHAGOGASTRODUODENOSCOPY (EGD) WITH PROPOFOL N/A 05/21/2017   Dr. Cloretta Ned: Erythematous mucosa in the antrum, gastric biopsies with active gastropathy but no H. pylori, small bowel biopsies negative, esophageal biopsies consistent with reflux.   TYMPANOSTOMY TUBE PLACEMENT      OB History   No obstetric history on file.      Home Medications    Prior to Admission medications   Medication Sig Start Date End Date Taking? Authorizing Provider  promethazine (PHENERGAN) 12.5 MG tablet Take 1  tablet (12.5 mg total) by mouth every 8 (eight) hours as needed for refractory nausea / vomiting (not improved with Zofran). Do not take with alcohol or while driving or operating heavy machinery 05/17/22  Yes Valentino Nose, NP  albuterol (PROVENTIL HFA;VENTOLIN HFA) 108 (90 Base) MCG/ACT inhaler Inhale 2 puffs into the lungs every 4 (four) hours as needed for wheezing or shortness of breath.     [provider]  amitriptyline (ELAVIL) 10 MG tablet TAKE 3 TABLETS BY MOUTH ONCE AT BEDTIME. 10/26/21   Letta Median, PA-C  ondansetron (ZOFRAN) 4 MG tablet Take 1 tablet (4 mg total) by mouth every 8 (eight) hours as needed for nausea or vomiting. 10/10/21   Gelene Mink, NP  pantoprazole (PROTONIX) 40 MG tablet Take 1 tablet (40 mg total) by mouth daily. 30 minutes before breakfast 10/10/21   Gelene Mink, NP    Family History Family History  Problem Relation Age of Onset   Stroke Maternal Grandmother    Hypertension Mother    Multiple sclerosis Mother    Irritable bowel syndrome Mother    Other Mother        gastroparesis   Inflammatory bowel disease Neg Hx    Colon cancer Neg Hx    Liver cancer Neg Hx    Cirrhosis Neg Hx  Social History Social History   Tobacco Use   Smoking status: Never   Smokeless tobacco: Never  Vaping Use   Vaping Use: Never used  Substance Use Topics   Alcohol use: No   Drug use: No     Allergies   Dairy aid [tilactase], Other, and Yeast-related products   Review of Systems Review of Systems Per HPI  Physical Exam Triage Vital Signs ED Triage Vitals  Enc Vitals Group     BP 05/17/22 1302 133/84     Pulse Rate 05/17/22 1302 (!) 103     Resp 05/17/22 1302 18     Temp 05/17/22 1302 98.2 F (36.8 C)     Temp Source 05/17/22 1302 Oral     SpO2 05/17/22 1302 98 %     Weight --      Height --      Head Circumference --      Peak Flow --      Pain Score 05/17/22 1304 1     Pain Loc --      Pain Edu? --      Excl. in GC?  --    No data found.  Updated Vital Signs BP 133/84 (BP Location: Right Arm)   Pulse (!) 103   Temp 98.2 F (36.8 C) (Oral)   Resp 18   LMP 05/02/2022 (Approximate)   SpO2 98%   Visual Acuity Right Eye Distance:   Left Eye Distance:   Bilateral Distance:    Right Eye Near:   Left Eye Near:    Bilateral Near:     Physical Exam Vitals and nursing note reviewed.  Constitutional:      General: She is not in acute distress.    Appearance: Normal appearance. She is not toxic-appearing.  HENT:     Head: Normocephalic and atraumatic.     Mouth/Throat:     Mouth: Mucous membranes are moist.     Pharynx: Oropharynx is clear.  Eyes:     General: No scleral icterus.    Extraocular Movements: Extraocular movements intact.  Cardiovascular:     Rate and Rhythm: Regular rhythm. Tachycardia present.  Pulmonary:     Effort: Pulmonary effort is normal. No respiratory distress.     Breath sounds: Normal breath sounds. No wheezing, rhonchi or rales.  Abdominal:     General: Abdomen is flat. Bowel sounds are normal. There is no distension.     Palpations: Abdomen is soft.     Tenderness: There is no abdominal tenderness. There is no right CVA tenderness, left CVA tenderness, guarding or rebound.  Skin:    General: Skin is warm and dry.     Capillary Refill: Capillary refill takes less than 2 seconds.     Coloration: Skin is not jaundiced or pale.     Findings: No erythema.  Neurological:     Mental Status: She is alert and oriented to person, place, and time.  Psychiatric:        Behavior: Behavior is cooperative.      UC Treatments / Results  Labs (all labs ordered are listed, but only abnormal results are displayed) Labs Reviewed - No data to display  EKG   Radiology No results found.  Procedures Procedures (including critical care time)  Medications Ordered in UC Medications  alum & mag hydroxide-simeth (MAALOX/MYLANTA) 200-200-20 MG/5ML suspension 30 mL (30 mLs  Oral Given 05/17/22 1324)    Initial Impression / Assessment and Plan / UC Course  I  have reviewed the triage vital signs and the nursing notes.  Pertinent labs & imaging results that were available during my care of the patient were reviewed by me and considered in my medical decision making (see chart for details).    Patient is a very pleasant, well-appearing 21 year old female presenting for epigastric abdominal pain, nausea/vomiting today.  GI cocktail given today with mild relief of symptoms.  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  Will send in a prescription for phenergan if Zofran ineffective.  Discussed pushing hydration.  Does not have infectious symptoms today and is clear to return to work as long as fever free.  Note given.  Encouraged close follow up with GI if symptoms persist despite treatment.  If symptoms worsen, go to ER. Final Clinical Impressions(s) / UC Diagnoses   Final diagnoses:  Nausea and vomiting, unspecified vomiting type  Abdominal pain, chronic, epigastric     Discharge Instructions      - We have given you Maalox/Mylanta today to help with the abdominal pain and nausea/vomiting - Continue the zofran as prescribed for nausea/vomiting.  Make sure you are drinking plenty of liquids - If this is unhelpful, you can take phenergan as needed.  This medicine may make you sleepy so do not take with alcohol or while driving or operating heavy machinery - If nausea/vomiting persist, follow up with your GI provider    ED Prescriptions     Medication Sig Dispense Auth. Provider   promethazine (PHENERGAN) 12.5 MG tablet Take 1 tablet (12.5 mg total) by mouth every 8 (eight) hours as needed for refractory nausea / vomiting (not improved with Zofran). Do not take with alcohol or while driving or operating heavy machinery 30 tablet Valentino Nose, NP      PDMP not reviewed this encounter.   Valentino Nose,  NP 05/17/22 1624

## 2022-05-17 NOTE — Discharge Instructions (Addendum)
-   We have given you Maalox/Mylanta today to help with the abdominal pain and nausea/vomiting - Continue the zofran as prescribed for nausea/vomiting.  Make sure you are drinking plenty of liquids - If this is unhelpful, you can take phenergan as needed.  This medicine may make you sleepy so do not take with alcohol or while driving or operating heavy machinery - If nausea/vomiting persist, follow up with your GI provider

## 2022-05-24 DIAGNOSIS — J45909 Unspecified asthma, uncomplicated: Secondary | ICD-10-CM | POA: Diagnosis not present

## 2022-05-25 DIAGNOSIS — M9902 Segmental and somatic dysfunction of thoracic region: Secondary | ICD-10-CM | POA: Diagnosis not present

## 2022-05-25 DIAGNOSIS — M542 Cervicalgia: Secondary | ICD-10-CM | POA: Diagnosis not present

## 2022-05-25 DIAGNOSIS — M9901 Segmental and somatic dysfunction of cervical region: Secondary | ICD-10-CM | POA: Diagnosis not present

## 2022-05-25 DIAGNOSIS — M546 Pain in thoracic spine: Secondary | ICD-10-CM | POA: Diagnosis not present

## 2022-06-12 ENCOUNTER — Encounter: Payer: Self-pay | Admitting: *Deleted

## 2022-06-12 ENCOUNTER — Encounter: Payer: Self-pay | Admitting: Internal Medicine

## 2022-06-12 ENCOUNTER — Ambulatory Visit: Payer: BC Managed Care – PPO | Admitting: Internal Medicine

## 2022-06-12 VITALS — BP 127/85 | HR 102 | Temp 97.1°F | Ht 64.0 in | Wt 208.8 lb

## 2022-06-12 DIAGNOSIS — R1310 Dysphagia, unspecified: Secondary | ICD-10-CM

## 2022-06-12 DIAGNOSIS — K219 Gastro-esophageal reflux disease without esophagitis: Secondary | ICD-10-CM | POA: Diagnosis not present

## 2022-06-12 DIAGNOSIS — K5909 Other constipation: Secondary | ICD-10-CM

## 2022-06-12 NOTE — Progress Notes (Signed)
Primary Care Physician:  Elfredia Nevins, MD Primary Gastroenterologist:  Dr. Jena Gauss  Pre-Procedure History & Physical: HPI:  Gina Scott is a 21 y.o. female here for with GERD, intermittent nausea,  some vomiting from time to time for years.  Here for follow-up.  Has esophageal dysphagia.  Was slated for an EGD earlier this year but canceled because of no insurance.  That is an ongoing symptom.  Nausea and reflux much better when she takes Protonix 40 mg each morning.  Sometimes misses a dose.  Mainly bloated.  Tendency towards constipation.  No melena or rectal bleeding.  She has gained about 20 pounds in the past 5 years.  Has been on varying doses of amitriptyline at bedtime's since being seen by her pediatric gastroenterologist lastly several years ago.  She may take as much is 60 mg a day for her "nausea".  She really does not have any abdominal pain.  Last EGD 2018.  Report reviewed.  Essentially unremarkable with negative esophageal gastric and duodenal biopsies.  Reported history of H. pylori in the past and she was treated.  Of note, histology negative by Warthin-Starry stain 2018.  She states she is allergic to yeast she gets a runny nose.  No apparent gluten issues in the past and duodenal mucosa appeared normal previously.  She does have a habit of chewing rubber and plastic and sometimes swallows fragments.  Past Medical History:  Diagnosis Date   ADHD (attention deficit hyperactivity disorder)    Anemia    Asthma    Gastroparesis    GERD (gastroesophageal reflux disease)    H. pylori infection    PMH:   Headache    Heart murmur    as an infant   Hypersomnia    per patient   Obesity (BMI 30-39.9)    Panic attacks     Past Surgical History:  Procedure Laterality Date   ESOPHAGOGASTRODUODENOSCOPY (EGD) WITH PROPOFOL N/A 05/21/2017   Dr. Cloretta Ned: Erythematous mucosa in the antrum, gastric biopsies with active gastropathy but no H. pylori, small bowel biopsies  negative, esophageal biopsies consistent with reflux.   TYMPANOSTOMY TUBE PLACEMENT      Prior to Admission medications   Medication Sig Start Date End Date Taking? Authorizing Provider  albuterol (PROVENTIL HFA;VENTOLIN HFA) 108 (90 Base) MCG/ACT inhaler Inhale 2 puffs into the lungs every 4 (four) hours as needed for wheezing or shortness of breath.    Yes [provider]  amitriptyline (ELAVIL) 10 MG tablet TAKE 3 TABLETS BY MOUTH ONCE AT BEDTIME. 10/26/21  Yes Clearance Coots, Kristen S, PA-C  ondansetron (ZOFRAN) 4 MG tablet Take 1 tablet (4 mg total) by mouth every 8 (eight) hours as needed for nausea or vomiting. 10/10/21  Yes Gelene Mink, NP  pantoprazole (PROTONIX) 40 MG tablet Take 1 tablet (40 mg total) by mouth daily. 30 minutes before breakfast 10/10/21  Yes Gelene Mink, NP    Allergies as of 06/12/2022 - Review Complete 06/12/2022  Allergen Reaction Noted   Dairy aid [tilactase]  12/14/2016   Other  10/18/2021   Yeast-related products  10/18/2021    Family History  Problem Relation Age of Onset   Stroke Maternal Grandmother    Hypertension Mother    Multiple sclerosis Mother    Irritable bowel syndrome Mother    Other Mother        gastroparesis   Inflammatory bowel disease Neg Hx    Colon cancer Neg Hx    Liver  cancer Neg Hx    Cirrhosis Neg Hx     Social History   Socioeconomic History   Marital status: Single    Spouse name: Not on file   Number of children: Not on file   Years of education: Not on file   Highest education level: Not on file  Occupational History   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 [place]   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 [employer]  Tobacco Use   Smoking status: Never   Smokeless tobacco: Never  Vaping Use   Vaping Use: Never used  Substance and Sexual Activity   Alcohol use: Yes    Comment: occ   Drug use: No   Sexual activity: Never  Other Topics Concern   Not on file  Social History Narrative   Not  on file   Social Determinants of Health   Financial Resource Strain: Not on file  Food Insecurity: Not on file  Transportation Needs: Not on file  Physical Activity: Not on file  Stress: Not on file  Social Connections: Not on file  Intimate Partner Violence: Not on file    Review of Systems: See HPI, otherwise negative ROS  Physical Exam: BP 127/85 (BP Location: Left Arm, Patient Position: Sitting, Cuff Size: Normal)   Pulse (!) 102   Temp (!) 97.1 F (36.2 C) (Temporal)   Ht 5\' 4"  (1.626 m)   Wt 208 lb 12.8 oz (94.7 kg)   LMP 05/22/2022 (Approximate)   SpO2 99%   BMI 35.84 kg/m  General:   Alert,   pleasant and cooperative in NAD Neck:  Supple; no masses or thyromegaly. No significant cervical adenopathy. Lungs:  Clear throughout to auscultation.   No wheezes, crackles, or rhonchi. No acute distress. Heart:  Regular rate and rhythm; no murmurs, clicks, rubs,  or gallops. Abdomen: Non-distended, normal bowel sounds.  Soft and nontender without appreciable mass or hepatosplenomegaly.  Pulses:  Normal pulses noted. Extremities:  Without clubbing or edema.  Impression/Plan: Pleasant 21 year old lady with chronic nausea and GERD.  Occasional vomiting.  Reported history of gastroparesis but normal gastric emptying previously.  Superimposed obesity.  At times,  on significant doses of amitriptyline.  At this time, her main GI issue is that of reflux manifested with the symptoms of nausea from time to time.  I suspect amitriptyline is now counterproductive as this medication may be causing delaying gastric emptying along with constipation.  This 30 would most likely benefit from bolstered antireflux regimen.  Dysphagia continues to need further evaluation.   Recommendations:  As discussed, we will treat you more aggressively for acid reflux as it may be part of your nausea issue  Specifically, increase pantoprazole or Protonix to 40 mg 30 minutes before breakfast and  supper  As discussed, amitriptylin may be counterproductive at this time (could actually slow gastric emptying and cause constipation).  I recommend amitriptyline be decreased to 10 mg at bedtime for the next 6 weeks and then stop taking it entirely -by October 5.  For your difficulty swallowing, I have recommended an upper endoscopy/EGD with possible esophageal dilation.  ASA 2.  We have discussed the risks, benefits and alternatives.  We will schedule at your convenience in the near future.   The risks, benefits, limitations, alternatives and imponderables have been reviewed with the patient. Potential for esophageal dilation, biopsy, etc. have also been reviewed.  Questions have been answered.  Patient is agreeable. 10 pound weight loss between now and the end of the calendar  year.  Because of your snoring and daytime sleepiness, I suggest you speak with Dr. Carlena Sax about undergoing a sleep study.  Office visit back here in 3 months       Notice: This dictation was prepared with Dragon dictation along with smaller phrase technology. Any transcriptional errors that result from this process are unintentional and may not be corrected upon review.

## 2022-06-12 NOTE — Patient Instructions (Signed)
It was nice to see you today!  As discussed, we will treat you more aggressively for acid reflux as it may be part of your nausea issue  Specifically, increase pantoprazole or Protonix to 40 mg 30 minutes before breakfast and supper  As discussed, amitriptylin may be counterproductive at this time (could actually slow gastric emptying and cause constipation).  I recommend amitriptyline be decreased to 10 mg at bedtime for the next 6 weeks and then stop taking it entirely -by October 5.  For your difficulty swallowing, I have recommended an upper endoscopy/EGD with possible esophageal dilation.  ASA 2.  We have discussed the risks, benefits and alternatives.  We will schedule at your convenience in the near future.    10 pound weight loss between now and the end of the calendar year.  Because of your snoring and daytime sleepiness, I suggest you speak with Dr. Carlena Sax about undergoing a sleep study.  Office visit back here in 3 months

## 2022-06-22 DIAGNOSIS — M542 Cervicalgia: Secondary | ICD-10-CM | POA: Diagnosis not present

## 2022-06-22 DIAGNOSIS — M9901 Segmental and somatic dysfunction of cervical region: Secondary | ICD-10-CM | POA: Diagnosis not present

## 2022-06-22 DIAGNOSIS — M546 Pain in thoracic spine: Secondary | ICD-10-CM | POA: Diagnosis not present

## 2022-06-22 DIAGNOSIS — M9902 Segmental and somatic dysfunction of thoracic region: Secondary | ICD-10-CM | POA: Diagnosis not present

## 2022-07-16 ENCOUNTER — Encounter (HOSPITAL_COMMUNITY): Payer: Self-pay | Admitting: Internal Medicine

## 2022-07-16 ENCOUNTER — Other Ambulatory Visit: Payer: Self-pay

## 2022-07-16 ENCOUNTER — Encounter (HOSPITAL_COMMUNITY)
Admission: RE | Disposition: A | Discharge status: Home / Self Care | Payer: Self-pay | Source: Home / Self Care | Attending: Internal Medicine

## 2022-07-16 ENCOUNTER — Ambulatory Visit (HOSPITAL_COMMUNITY): Payer: BC Managed Care – PPO | Admitting: Anesthesiology

## 2022-07-16 ENCOUNTER — Ambulatory Visit (HOSPITAL_COMMUNITY)
Admission: RE | Admit: 2022-07-16 | Discharge: 2022-07-16 | Disposition: A | Discharge status: Home / Self Care | Payer: BC Managed Care – PPO | Attending: Internal Medicine | Admitting: Internal Medicine

## 2022-07-16 DIAGNOSIS — K2950 Unspecified chronic gastritis without bleeding: Secondary | ICD-10-CM | POA: Insufficient documentation

## 2022-07-16 DIAGNOSIS — F419 Anxiety disorder, unspecified: Secondary | ICD-10-CM | POA: Insufficient documentation

## 2022-07-16 DIAGNOSIS — J45909 Unspecified asthma, uncomplicated: Secondary | ICD-10-CM | POA: Insufficient documentation

## 2022-07-16 DIAGNOSIS — K219 Gastro-esophageal reflux disease without esophagitis: Secondary | ICD-10-CM | POA: Diagnosis not present

## 2022-07-16 DIAGNOSIS — R110 Nausea: Secondary | ICD-10-CM | POA: Diagnosis not present

## 2022-07-16 DIAGNOSIS — R1310 Dysphagia, unspecified: Secondary | ICD-10-CM | POA: Diagnosis not present

## 2022-07-16 HISTORY — PX: MALONEY DILATION: SHX5535

## 2022-07-16 HISTORY — PX: ESOPHAGOGASTRODUODENOSCOPY (EGD) WITH PROPOFOL: SHX5813

## 2022-07-16 HISTORY — PX: BIOPSY: SHX5522

## 2022-07-16 LAB — PREGNANCY, URINE: Preg Test, Ur: NEGATIVE

## 2022-07-16 SURGERY — ESOPHAGOGASTRODUODENOSCOPY (EGD) WITH PROPOFOL
Anesthesia: General

## 2022-07-16 MED ORDER — PROPOFOL 10 MG/ML IV BOLUS
INTRAVENOUS | Status: DC | PRN
  Administered 2022-07-16: 60 mg via INTRAVENOUS
  Administered 2022-07-16: 120 mg via INTRAVENOUS
  Administered 2022-07-16: 30 mg via INTRAVENOUS
  Administered 2022-07-16 (×2): 20 mg via INTRAVENOUS

## 2022-07-16 MED ORDER — LACTATED RINGERS IV SOLN: INTRAVENOUS | Status: DC

## 2022-07-16 MED ORDER — LIDOCAINE HCL (CARDIAC) PF 100 MG/5ML IV SOSY
PREFILLED_SYRINGE | INTRAVENOUS | Status: DC | PRN
  Administered 2022-07-16: 50 mg via INTRATRACHEAL

## 2022-07-16 NOTE — Anesthesia Postprocedure Evaluation (Signed)
Anesthesia Post Note  Patient: Gina Scott  Procedure(s) Performed: ESOPHAGOGASTRODUODENOSCOPY (EGD) WITH PROPOFOL [place]  Patient location during evaluation: Phase II Anesthesia Type: General Level of consciousness: awake Pain management: pain level controlled Vital Signs Assessment: post-procedure vital signs reviewed and stable Respiratory status: spontaneous breathing and respiratory function stable Cardiovascular status: blood pressure returned to baseline and stable Postop Assessment: no headache and no apparent nausea or vomiting Anesthetic complications: no Comments: Late entry   No notable events documented.   Last Vitals:  Vitals:   07/16/22 1053 07/16/22 1057  BP: (!) 90/41 (!) 95/51  Pulse: 62 60  Resp: 16 18  Temp: 36.4 C   SpO2: 98% 98%    Last Pain:  Vitals:   07/16/22 1057  TempSrc:   PainSc: 0-No pain                 Louann Sjogren

## 2022-07-16 NOTE — Op Note (Signed)
[facility] Patient Name: Gina Scott Procedure Date: 07/16/2022 10:22 AM MRN: [id number] Date of Birth: Mar 16, 2001 Attending MD: Norvel Richards , MD CSN: [id number] Age: 21 Admit Type: Outpatient Procedure:                Upper GI endoscopy Indications:              Dysphagia, Nausea Providers:                Norvel Richards, MD, Janeece Riggers, RN, Casimer Bilis, Technician Referring MD:              Medicines:                Propofol per Anesthesia Complications:            No immediate complications. Estimated Blood Loss:     Estimated blood loss was minimal. Procedure:                Pre-Anesthesia Assessment:                           - Prior to the procedure, a History and Physical                            was performed, and patient medications and                            allergies were reviewed. The patient's tolerance of                            previous anesthesia was also reviewed. The risks                            and benefits of the procedure and the sedation                            options and risks were discussed with the patient.                            All questions were answered, and informed consent                            was obtained. Prior Anticoagulants: The patient has                            taken no previous anticoagulant or antiplatelet                            agents. ASA Grade Assessment: II - A patient with                            mild systemic disease. After reviewing the risks  and benefits, the patient was deemed in                            satisfactory condition to undergo the procedure.                           After obtaining informed consent, the endoscope was                            passed under direct vision. Throughout the                            procedure, the patient's blood pressure, pulse, and                            oxygen  saturations were monitored continuously. The                            GIF-H190 ([id number]) scope was introduced through the                            mouth, and advanced to the second part of duodenum.                            The upper GI endoscopy was accomplished without                            difficulty. The patient tolerated the procedure                            well. Scope In: 10:41:25 AM Scope Out: 10:48:53 AM Total Procedure Duration: 0 hours 7 minutes 28 seconds  Findings:      The examined esophagus was normal.      The entire examined stomach was normal.      The duodenal bulb and second portion of the duodenum were normal. The       scope was withdrawn. Dilation was performed with a Maloney dilator with       mild resistance at 54 Fr. The dilation site was examined following       endoscope reinsertion and showed no change. Estimated blood loss: none.       Finally, biopsies of the gastric and duodenal mucosa taken for       histologic study. Impression:               - Normal esophagus. Dilated.                           - Normal stomach.                           - Normal duodenal bulb and second portion of the                            duodenum.                           -  Status post gastric and duodenal biopsy. Patient                            stopped amitriptyline acutely. Did not decrease to                            10 mg as instructed. States medication did not                            agree with her and she did not like taking it. Her                            nausea persists. Moderate Sedation:      Moderate (conscious) sedation was personally administered by an       anesthesia professional. The following parameters were monitored: oxygen       saturation, heart rate, blood pressure, respiratory rate, EKG, adequacy       of pulmonary ventilation, and response to care. Recommendation:           - Patient has a contact number available for                             emergencies. The signs and symptoms of potential                            delayed complications were discussed with the                            patient. Return to normal activities tomorrow.                            Written discharge instructions were provided to the                            patient.                           - Advance diet as tolerated.                           - Continue present medications.                           - Return to my office in 6 weeks. Procedure Code(s):        --- Professional ---                           [id number], Esophagogastroduodenoscopy, flexible,                            transoral; diagnostic, including collection of                            specimen(s) by brushing or washing, when performed                            (  separate procedure)                           43450, Dilation of esophagus, by unguided sound or                            bougie, single or multiple passes Diagnosis Code(s):        --- Professional ---                           R13.10, Dysphagia, unspecified                           R11.0, Nausea CPT copyright 2019 American Medical Association. All rights reserved. The codes documented in this report are preliminary and upon coder review may  be revised to meet current compliance requirements. Gerrit Friends. Sharlette Jansma, MD Gennette Pac, MD 07/16/2022 10:59:55 AM This report has been signed electronically. Number of Addenda: 0

## 2022-07-16 NOTE — Transfer of Care (Signed)
Immediate Anesthesia Transfer of Care Note  Patient: Gina Scott  Procedure(s) Performed: ESOPHAGOGASTRODUODENOSCOPY (EGD) WITH PROPOFOL MALONEY DILATION BIOPSY  Patient Location: PACU  Anesthesia Type:General  Level of Consciousness: awake  Airway & Oxygen Therapy: Patient Spontanous Breathing  Post-op Assessment: Report given to RN, Post -op Vital signs reviewed and stable and Patient moving all extremities  Post vital signs: Reviewed and stable  Last Vitals:  Vitals Value Taken Time  BP 90/41 07/16/22 1053  Temp 36.4 C 07/16/22 1053  Pulse 62 07/16/22 1053  Resp 16 07/16/22 1053  SpO2 98 % 07/16/22 1053    Last Pain:  Vitals:   07/16/22 1053  TempSrc: Axillary  PainSc: 0-No pain      Patients Stated Pain Goal: 8 (45/36/46 8032)  Complications: No notable events documented.

## 2022-07-16 NOTE — H&P (Signed)
@LOGO @   Primary Care Physician:  Redmond School, MD Primary Gastroenterologist:  Dr.   Pre-Procedure History & Physical: HPI:  Gina Scott is a 21 y.o. female here for  further evaluation dysphagia, nausea and reflux symptoms.   Dysphagia persists.  Nausea unchanged.  Did not go down to amitriptyline 10 mg daily she just started stopped it completely.  States it did not agree with her anyway.  Really not much reflux on twice daily Protonix.  Past Medical History:  Diagnosis Date   ADHD (attention deficit hyperactivity disorder)    Anemia    Asthma    Gastroparesis    GERD (gastroesophageal reflux disease)    H. pylori infection    PMH:   Headache    Heart murmur    as an infant   Hypersomnia    per patient   Obesity (BMI 30-39.9)    Panic attacks     Past Surgical History:  Procedure Laterality Date   ESOPHAGOGASTRODUODENOSCOPY (EGD) WITH PROPOFOL N/A 05/21/2017   Dr. Alease Frame: Erythematous mucosa in the antrum, gastric biopsies with active gastropathy but no H. pylori, small bowel biopsies negative, esophageal biopsies consistent with reflux.   TYMPANOSTOMY TUBE PLACEMENT      Prior to Admission medications   Medication Sig Start Date End Date Taking? Authorizing Provider  albuterol (PROVENTIL HFA;VENTOLIN HFA) 108 (90 Base) MCG/ACT inhaler Inhale 2 puffs into the lungs every 4 (four) hours as needed for wheezing or shortness of breath.    Yes [provider]  Ferrous Sulfate (IRON PO) Take 1 tablet by mouth every other day.   Yes [provider]  pantoprazole (PROTONIX) 40 MG tablet Take 1 tablet (40 mg total) by mouth daily. 30 minutes before breakfast Patient taking differently: Take 40 mg by mouth 2 (two) times daily. 30 minutes before breakfast 10/10/21  Yes Annitta Needs, NP  amitriptyline (ELAVIL) 10 MG tablet TAKE 3 TABLETS BY MOUTH ONCE AT BEDTIME. Patient not taking: Reported on 07/13/2022 10/26/21   Erenest Rasher, PA-C    Allergies as of  06/12/2022 - Review Complete 06/12/2022  Allergen Reaction Noted   Dairy aid [tilactase]  12/14/2016   Other  10/18/2021   Yeast-related products  10/18/2021    Family History  Problem Relation Age of Onset   Stroke Maternal Grandmother    Hypertension Mother    Multiple sclerosis Mother    Irritable bowel syndrome Mother    Other Mother        gastroparesis   Inflammatory bowel disease Neg Hx    Colon cancer Neg Hx    Liver cancer Neg Hx    Cirrhosis Neg Hx     Social History   Socioeconomic History   Marital status: Single    Spouse name: Not on file   Number of children: Not on file   Years of education: Not on file   Highest education level: Not on file  Occupational History   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 [place]   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 [place] Use   Smoking status: Never   Smokeless tobacco: Never  Vaping Use   Vaping Use: Never used  Substance and Sexual Activity   Alcohol use: Yes    Comment: occ   Drug use: No   Sexual activity: Never  Other Topics Concern   Not on file  Social History Narrative   Not on file   Social Determinants of Health   Financial Resource Strain:  Not on file  Food Insecurity: Not on file  Transportation Needs: Not on file  Physical Activity: Not on file  Stress: Not on file  Social Connections: Not on file  Intimate Partner Violence: Not on file    Review of Systems: See HPI, otherwise negative ROS  Physical Exam: BP 123/65   Pulse 64   Temp 98 F (36.7 C) (Oral)   Resp 18   Ht 5\' 4"  (1.626 m)   Wt 93.9 kg   LMP 06/18/2022 (Approximate)   SpO2 98%   BMI 35.53 kg/m  General:   Alert,  Well-developed, well-nourished, pleasant and cooperative in NAD Mouth:  No deformity or lesions. Neck:  Supple; no masses or thyromegaly. No significant cervical adenopathy. Lungs:  Clear throughout to auscultation.   No wheezes, crackles, or rhonchi. No acute distress. Heart:   Regular rate and rhythm; no murmurs, clicks, rubs,  or gallops. Abdomen: Non-distended, normal bowel sounds.  Soft and nontender without appreciable mass or hepatosplenomegaly.  Pulses:  Normal pulses noted. Extremities:  Without clubbing or edema.  Impression/Plan:    21 year old lady with GERD well-controlled on twice daily PPI.  Ongoing nausea.  Now completely off amitriptyline esophageal dysphagia.  Recommendations: I have offered the patient a  EGD today with possible esophageal dilation as feasible/appropriate per plan.The risks, benefits, limitations, alternatives and imponderables have been reviewed with the patient. Potential for esophageal dilation, biopsy, etc. have also been reviewed.  Questions have been answered. All parties agreeable.      Notice: This dictation was prepared with Dragon dictation along with smaller phrase technology. Any transcriptional errors that result from this process are unintentional and may not be corrected upon review.

## 2022-07-16 NOTE — Anesthesia Preprocedure Evaluation (Signed)
Anesthesia Evaluation  Patient identified by MRN, date of birth, ID band Patient awake    Reviewed: Allergy & Precautions, H&P , NPO status , Patient's Chart, lab work & pertinent test results, reviewed documented beta blocker date and time   Airway Mallampati: II  TM Distance: >3 FB Neck ROM: full    Dental no notable dental hx.    Pulmonary asthma ,    Pulmonary exam normal breath sounds clear to auscultation       Cardiovascular Exercise Tolerance: Good negative cardio ROS   Rhythm:regular Rate:Normal     Neuro/Psych  Headaches, PSYCHIATRIC DISORDERS Anxiety    GI/Hepatic Neg liver ROS, GERD  Medicated,  Endo/Other  negative endocrine ROS  Renal/GU negative Renal ROS  negative genitourinary   Musculoskeletal   Abdominal   Peds  Hematology  (+) Blood dyscrasia, anemia ,   Anesthesia Other Findings   Reproductive/Obstetrics negative OB ROS                             Anesthesia Physical Anesthesia Plan  ASA: 2  Anesthesia Plan: General   Post-op Pain Management:    Induction:   PONV Risk Score and Plan: Propofol infusion  Airway Management Planned:   Additional Equipment:   Intra-op Plan:   Post-operative Plan:   Informed Consent: I have reviewed the patients History and Physical, chart, labs and discussed the procedure including the risks, benefits and alternatives for the proposed anesthesia with the patient or authorized representative who has indicated his/her understanding and acceptance.     Dental Advisory Given  Plan Discussed with: CRNA  Anesthesia Plan Comments:         Anesthesia Quick Evaluation

## 2022-07-16 NOTE — Discharge Instructions (Addendum)
EGD Discharge instructions Please read the instructions outlined below and refer to this sheet in the next few weeks. These discharge instructions provide you with general information on caring for yourself after you leave the hospital. Your doctor may also give you specific instructions. While your treatment has been planned according to the most current medical practices available, unavoidable complications occasionally occur. If you have any problems or questions after discharge, please call your doctor. ACTIVITY You may resume your regular activity but move at a slower pace for the next 24 hours.  Take frequent rest periods for the next 24 hours.  Walking will help expel (get rid of) the air and reduce the bloated feeling in your abdomen.  No driving for 24 hours (because of the anesthesia (medicine) used during the test).  You may shower.  Do not sign any important legal documents or operate any machinery for 24 hours (because of the anesthesia used during the test).  NUTRITION Drink plenty of fluids.  You may resume your normal diet.  Begin with a light meal and progress to your normal diet.  Avoid alcoholic beverages for 24 hours or as instructed by your caregiver.  MEDICATIONS You may resume your normal medications unless your caregiver tells you otherwise.  WHAT YOU CAN EXPECT TODAY You may experience abdominal discomfort such as a feeling of fullness or "gas" pains.  FOLLOW-UP Your doctor will discuss the results of your test with you.  SEEK IMMEDIATE MEDICAL ATTENTION IF ANY OF THE FOLLOWING OCCUR: Excessive nausea (feeling sick to your stomach) and/or vomiting.  Severe abdominal pain and distention (swelling).  Trouble swallowing.  Temperature over 101 F (37.8 C).  Rectal bleeding or vomiting of blood.      Your upper GI tract (esophagus, stomach and duodenum) appeared normal).  Your esophagus was stretched today.  I took samples of your stomach and duodenum .     continue taking Protonix twice daily before breakfast and supper  Further recommendations to follow pending review of pathology report  Office visit with Roseanne Kaufman in 6 weeks   [employer] UP APPOINTMENT  At patient request, I called Willeen Cass at [phone] -

## 2022-07-19 DIAGNOSIS — M9901 Segmental and somatic dysfunction of cervical region: Secondary | ICD-10-CM | POA: Diagnosis not present

## 2022-07-19 DIAGNOSIS — M546 Pain in thoracic spine: Secondary | ICD-10-CM | POA: Diagnosis not present

## 2022-07-19 DIAGNOSIS — M9902 Segmental and somatic dysfunction of thoracic region: Secondary | ICD-10-CM | POA: Diagnosis not present

## 2022-07-19 DIAGNOSIS — M542 Cervicalgia: Secondary | ICD-10-CM | POA: Diagnosis not present

## 2022-07-19 LAB — SURGICAL PATHOLOGY

## 2022-07-21 ENCOUNTER — Encounter: Payer: Self-pay | Admitting: Internal Medicine

## 2022-07-23 ENCOUNTER — Encounter (HOSPITAL_COMMUNITY): Payer: Self-pay | Admitting: Internal Medicine

## 2022-08-27 DIAGNOSIS — M9901 Segmental and somatic dysfunction of cervical region: Secondary | ICD-10-CM | POA: Diagnosis not present

## 2022-08-27 DIAGNOSIS — M546 Pain in thoracic spine: Secondary | ICD-10-CM | POA: Diagnosis not present

## 2022-08-27 DIAGNOSIS — M9902 Segmental and somatic dysfunction of thoracic region: Secondary | ICD-10-CM | POA: Diagnosis not present

## 2022-08-27 DIAGNOSIS — M542 Cervicalgia: Secondary | ICD-10-CM | POA: Diagnosis not present

## 2022-09-04 ENCOUNTER — Encounter: Payer: Self-pay | Admitting: *Deleted

## 2022-09-04 ENCOUNTER — Encounter: Payer: Self-pay | Admitting: Gastroenterology

## 2022-09-04 ENCOUNTER — Ambulatory Visit (INDEPENDENT_AMBULATORY_CARE_PROVIDER_SITE_OTHER): Payer: BC Managed Care – PPO | Admitting: Gastroenterology

## 2022-09-04 VITALS — BP 121/81 | HR 94 | Temp 97.3°F | Ht 64.0 in | Wt 206.4 lb

## 2022-09-04 DIAGNOSIS — K219 Gastro-esophageal reflux disease without esophagitis: Secondary | ICD-10-CM | POA: Diagnosis not present

## 2022-09-04 DIAGNOSIS — R1013 Epigastric pain: Secondary | ICD-10-CM | POA: Diagnosis not present

## 2022-09-04 MED ORDER — ONDANSETRON HCL 4 MG PO TABS: 4.0000 mg | ORAL_TABLET | Freq: Three times a day (TID) | ORAL | 1 refills | Status: AC | PRN

## 2022-09-04 MED ORDER — SUCRALFATE 1 G PO TABS: 1.0000 g | ORAL_TABLET | Freq: Three times a day (TID) | ORAL | 3 refills | Status: AC

## 2022-09-04 NOTE — Progress Notes (Signed)
Gastroenterology Office Note     Primary Care Physician:  Redmond School, MD  Primary Gastroenterologist: Dr. Gala Romney    Chief Complaint   Chief Complaint  Patient presents with   Follow-up    Follow up after endoscopy and GERD     History of Present Illness   Gina Scott is a 21 y.o. female presenting today in follow-up with a history of chronic GERD, intermittent N/V, dysphagia, normal GES in Dec 2022.   EGD Sept 2023: normal esophagus s/p dilation, normal stomach and duodenum. Negative duodenal biopsies and H.pylori negative.   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 States can't take sedating meds. Nauseated when waking up or haven't eaten. After eating, will go away. Has epigastric burning but that improves after eating. Had retching episode yesterday that was the first incidence since the endoscopy. States amitriptyline stopped working.    Past Medical History:  Diagnosis Date   ADHD (attention deficit hyperactivity disorder)    Anemia    Asthma    Gastroparesis    GERD (gastroesophageal reflux disease)    H. pylori infection    PMH:   Headache    Heart murmur    as an infant   Hypersomnia    per patient   Obesity (BMI 30-39.9)    Panic attacks     Past Surgical History:  Procedure Laterality Date   BIOPSY  07/16/2022   Procedure: BIOPSY;  Surgeon: Daneil Dolin, MD;  Location: [facility];  Service: Endoscopy;;   ESOPHAGOGASTRODUODENOSCOPY (EGD) WITH PROPOFOL N/A 05/21/2017   Dr. Alease Frame: Erythematous mucosa in the antrum, gastric biopsies with active gastropathy but no H. pylori, small bowel biopsies negative, esophageal biopsies consistent with reflux.   ESOPHAGOGASTRODUODENOSCOPY (EGD) WITH PROPOFOL N/A 07/16/2022   Procedure: ESOPHAGOGASTRODUODENOSCOPY (EGD) WITH PROPOFOL;  Surgeon: Daneil Dolin, MD;  Location: [facility];  Service: Endoscopy;  Laterality: N/A;  10:30 am   MALONEY DILATION N/A 07/16/2022   Procedure: Venia Minks DILATION;  Surgeon: Daneil Dolin, MD;  Location: [facility];  Service: Endoscopy;  Laterality: N/A;   TYMPANOSTOMY TUBE PLACEMENT      Current Outpatient Medications  Medication Sig Dispense Refill   albuterol (PROVENTIL HFA;VENTOLIN HFA) 108 (90 Base) MCG/ACT inhaler Inhale 2 puffs into the lungs every 4 (four) hours as needed for wheezing or shortness of breath.      Ferrous Sulfate (IRON PO) Take 1 tablet by mouth every other day.     pantoprazole (PROTONIX) 40 MG tablet Take 1 tablet (40 mg total) by mouth daily. 30 minutes before breakfast (Patient taking differently: Take 40 mg by mouth 2 (two) times daily. 30 minutes before breakfast) 90 tablet 3   amitriptyline (ELAVIL) 10 MG tablet TAKE 3 TABLETS BY MOUTH ONCE AT BEDTIME. (Patient not taking: Reported on 07/13/2022) 90 tablet 1   No current facility-administered medications for this visit.    Allergies as of 09/04/2022 - Review Complete 09/04/2022  Allergen Reaction Noted   Dairy aid [tilactase]  12/14/2016   Other  10/18/2021   Yeast-related products  10/18/2021    Family History  Problem Relation Age of Onset   Stroke Maternal Grandmother    Hypertension Mother    Multiple sclerosis Mother    Irritable bowel syndrome Mother    Other Mother        gastroparesis   Inflammatory bowel disease Neg Hx    Colon cancer Neg Hx    Liver cancer Neg Hx  Cirrhosis Neg Hx     Social History   Socioeconomic History   Marital status: Single    Spouse name: Not on file   Number of children: Not on file   Years of education: Not on file   Highest education level: Not on file  Occupational History   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 [employer]  Tobacco Use   Smoking status: Never   Smokeless tobacco: Never  Vaping Use   Vaping Use: Never used  Substance and Sexual Activity   Alcohol use: Yes    Comment: occ   Drug use: No   Sexual activity: Never  Other Topics Concern   Not  on file  Social History Narrative   Not on file   Social Determinants of Health   Financial Resource Strain: Not on file  Food Insecurity: Not on file  Transportation Needs: Not on file  Physical Activity: Not on file  Stress: Not on file  Social Connections: Not on file  Intimate Partner Violence: Not on file     Review of Systems   Gen: Denies any fever, chills, fatigue, weight loss, lack of appetite.  CV: Denies chest pain, heart palpitations, peripheral edema, syncope.  Resp: Denies shortness of breath at rest or with exertion. Denies wheezing or cough.  GI: Denies dysphagia or odynophagia. Denies jaundice, hematemesis, fecal incontinence. GU : Denies urinary burning, urinary frequency, urinary hesitancy MS: Denies joint pain, muscle weakness, cramps, or limitation of movement.  Derm: Denies rash, itching, dry skin Psych: Denies depression, anxiety, memory loss, and confusion Heme: Denies bruising, bleeding, and enlarged lymph nodes.   Physical Exam   BP 121/81   Pulse 94   Temp (!) 97.3 F (36.3 C)   Ht 5\' 4"  (1.626 m)   Wt 206 lb 6.4 oz (93.6 kg)   LMP 08/23/2022   BMI 35.43 kg/m  General:   Alert and oriented. Pleasant and cooperative. Well-nourished and well-developed.  Head:  Normocephalic and atraumatic. Eyes:  Without icterus Abdomen:  +BS, soft, non-tender and non-distended. No HSM noted. No guarding or rebound. No masses appreciated.  Rectal:  Deferred  Msk:  Symmetrical without gross deformities. Normal posture. Extremities:  Without edema. Neurologic:  Alert and  oriented x4;  grossly normal neurologically. Skin:  Intact without significant lesions or rashes. Psych:  Alert and cooperative. Normal mood and affect.   Assessment   Gina Scott is a 21 y.o. female presenting today in follow-up with a history of chronic GERD, intermittent N/V, dysphagia, normal GES in Dec 2022.   GERD: EGD Sept 2023 with empiric dilation, negative duodenal  biopsies. Continue PPI BID  Dyspepsia: persistent. Intermittent nausea, epigastric burning improving after eating. Does not seem to be consistent with biliary process but will update 07-18-2002 abdomen. Zofran prn. Trial of Carafate. Amitriptyline in the past but states it lost efficacy.      PLAN   RUQ [place] PPI BID Zofran prn Carafate BID Further recommendations to follow    [place], PhD, ANP-BC [facility] Gastroenterology

## 2022-09-04 NOTE — Patient Instructions (Signed)
I have ordered an ultrasound of your gallbladder  Continue pantoprazole twice a day as you are doing.  I have added Zofran for nausea as needed.  I have also added a medication called Carafate that you can take up to 4 times a day. You can crush the tablet and mix with water.   Let us know how this works for you! Further recommendations after the ultrasound!  I enjoyed seeing you again today! As you know, I value our relationship and want to provide genuine, compassionate, and quality care. I welcome your feedback. If you receive a survey regarding your visit,  I greatly appreciate you taking time to fill this out. See you next time!  Gelene Mink, PhD, ANP-BC [facility] Gastroenterology

## 2022-09-12 ENCOUNTER — Ambulatory Visit (HOSPITAL_COMMUNITY)
Admission: RE | Admit: 2022-09-12 | Discharge: 2022-09-12 | Disposition: A | Discharge status: Home / Self Care | Payer: BC Managed Care – PPO | Source: Ambulatory Visit | Attending: Gastroenterology | Admitting: Gastroenterology

## 2022-09-12 DIAGNOSIS — R109 Unspecified abdominal pain: Secondary | ICD-10-CM | POA: Diagnosis not present

## 2022-09-12 DIAGNOSIS — R1013 Epigastric pain: Secondary | ICD-10-CM | POA: Diagnosis not present

## 2022-10-08 DIAGNOSIS — U071 COVID-19: Secondary | ICD-10-CM | POA: Diagnosis not present

## 2022-10-08 DIAGNOSIS — J45901 Unspecified asthma with (acute) exacerbation: Secondary | ICD-10-CM | POA: Diagnosis not present

## 2022-11-07 ENCOUNTER — Encounter: Payer: Self-pay | Admitting: Gastroenterology

## 2022-11-07 NOTE — Telephone Encounter (Signed)
Mandy, please arrange routine follow-up. Thanks!

## 2022-11-09 DIAGNOSIS — M9901 Segmental and somatic dysfunction of cervical region: Secondary | ICD-10-CM | POA: Diagnosis not present

## 2022-11-09 DIAGNOSIS — M9902 Segmental and somatic dysfunction of thoracic region: Secondary | ICD-10-CM | POA: Diagnosis not present

## 2022-11-09 DIAGNOSIS — M546 Pain in thoracic spine: Secondary | ICD-10-CM | POA: Diagnosis not present

## 2022-11-09 DIAGNOSIS — M542 Cervicalgia: Secondary | ICD-10-CM | POA: Diagnosis not present

## 2022-12-04 ENCOUNTER — Ambulatory Visit: Payer: BC Managed Care – PPO | Admitting: Gastroenterology

## 2023-07-29 ENCOUNTER — Ambulatory Visit
Admission: EM | Admit: 2023-07-29 | Discharge: 2023-07-29 | Disposition: A | Discharge status: Home / Self Care | Payer: BC Managed Care – PPO | Attending: Nurse Practitioner | Admitting: Nurse Practitioner

## 2023-07-29 DIAGNOSIS — Z23 Encounter for immunization: Secondary | ICD-10-CM | POA: Diagnosis not present

## 2023-07-29 DIAGNOSIS — S0502XA Injury of conjunctiva and corneal abrasion without foreign body, left eye, initial encounter: Secondary | ICD-10-CM | POA: Diagnosis not present

## 2023-07-29 MED ORDER — AMOXICILLIN-POT CLAVULANATE 875-125 MG PO TABS: 1.0000 | ORAL_TABLET | Freq: Two times a day (BID) | ORAL | 0 refills | Status: AC

## 2023-07-29 MED ORDER — TETANUS-DIPHTH-ACELL PERTUSSIS 5-2.5-18.5 LF-MCG/0.5 IM SUSY
0.5000 mL | PREFILLED_SYRINGE | Freq: Once | INTRAMUSCULAR | Status: AC
  Administered 2023-07-29: 0.5 mL via INTRAMUSCULAR

## 2023-07-29 MED ORDER — ERYTHROMYCIN 5 MG/GM OP OINT: 1.0000 | TOPICAL_OINTMENT | Freq: Four times a day (QID) | OPHTHALMIC | 0 refills | Status: AC

## 2023-07-29 NOTE — ED Provider Notes (Signed)
[facility]    CSN: [id number] Arrival date & time: 07/29/23  0825      History   Chief Complaint No chief complaint on file.   HPI Gina Scott is a 22 y.o. female.   The history is provided by the patient.   Patient presents with a possible cat scratch to the left eye.  Patient states that she has a kitten, and the kitten "pounced" on her this morning.  Patient reports light sensitivity, pain when she moves the eye, tearing, eye redness around the eye.  Patient denies fever, chills, purulent eye drainage, change in vision, loss of vision, or headache.  Patient reports that she does wear contacts.  States that she did not have her contacts in at the time of the injury.  Patient is unsure when she had her last tetanus shot.  Past Medical History:  Diagnosis Date   ADHD (attention deficit hyperactivity disorder)    Anemia    Asthma    Gastroparesis    GERD (gastroesophageal reflux disease)    H. pylori infection    PMH:   Headache    Heart murmur    as an infant   Hypersomnia    per patient   Obesity (BMI 30-39.9)    Panic attacks     Patient Active Problem List   Diagnosis Date Noted   Dysphagia 10/10/2021   GERD (gastroesophageal reflux disease) 08/05/2018   Dyspepsia 08/05/2018   Nausea with vomiting 08/05/2018    Past Surgical History:  Procedure Laterality Date   BIOPSY  07/16/2022   Procedure: BIOPSY;  Surgeon: Corbin Ade, MD;  [facility]: [facility];  Service: Endoscopy;;   ESOPHAGOGASTRODUODENOSCOPY (EGD) WITH PROPOFOL N/A 05/21/2017   Dr. Cloretta Ned: Erythematous mucosa in the antrum, gastric biopsies with active gastropathy but no H. pylori, small bowel biopsies negative, esophageal biopsies consistent with reflux.   ESOPHAGOGASTRODUODENOSCOPY (EGD) WITH PROPOFOL N/A 07/16/2022   normal esophagus s/p dilation, normal stomach and duodenum. Negative duodenal biopsies and H.pylori negative.   MALONEY DILATION N/A 07/16/2022   Procedure:  Elease Hashimoto DILATION;  Surgeon: Corbin Ade, MD;  Location: [facility];  Service: Endoscopy;  Laterality: N/A;   TYMPANOSTOMY TUBE PLACEMENT      OB History   No obstetric history on file.      Home Medications    Prior to Admission medications   Medication Sig Start Date End Date Taking? Authorizing Provider  amoxicillin-clavulanate (AUGMENTIN) 875-125 MG tablet Take 1 tablet by mouth every 12 (twelve) hours for 5 days. 07/29/23 08/03/23 Yes Reuven Braver-Warren, Sadie Haber, NP  erythromycin ophthalmic ointment Place 1 Application into the left eye 4 (four) times daily for 7 days. 07/29/23 08/05/23 Yes Sherida Dobkins-Warren, Sadie Haber, NP  albuterol (PROVENTIL HFA;VENTOLIN HFA) 108 (90 Base) MCG/ACT inhaler Inhale 2 puffs into the lungs every 4 (four) hours as needed for wheezing or shortness of breath.     [provider]  Ferrous Sulfate (IRON PO) Take 1 tablet by mouth every other day.    [provider]  ondansetron (ZOFRAN) 4 MG tablet Take 1 tablet (4 mg total) by mouth every 8 (eight) hours as needed for nausea or vomiting. 09/04/22   Gelene Mink, NP  pantoprazole (PROTONIX) 40 MG tablet Take 1 tablet (40 mg total) by mouth daily. 30 minutes before breakfast Patient taking differently: Take 40 mg by mouth 2 (two) times daily. 30 minutes before breakfast 10/10/21   Gelene Mink, NP  sucralfate (  CARAFATE) 1 g tablet Take 1 tablet (1 g total) by mouth 4 (four) times daily -  with meals and at bedtime. May crush and mix with water 09/04/22   Gelene Mink, NP    Family History Family History  Problem Relation Age of Onset   Stroke Maternal Grandmother    Hypertension Mother    Multiple sclerosis Mother    Irritable bowel syndrome Mother    Other Mother        gastroparesis   Inflammatory bowel disease Neg Hx    Colon cancer Neg Hx    Liver cancer Neg Hx    Cirrhosis Neg Hx     Social History Social History   Tobacco Use   Smoking status: Never   Smokeless  tobacco: Never  Vaping Use   Vaping status: Never Used  Substance Use Topics   Alcohol use: Yes    Comment: occ   Drug use: No     Allergies   Dairy aid [tilactase], Other, and Yeast-derived drug products   Review of Systems Review of Systems Per HPI  Physical Exam Triage Vital Signs ED Triage Vitals  Encounter Vitals Group     BP 07/29/23 0908 138/85     Systolic BP Percentile --      Diastolic BP Percentile --      Pulse Rate 07/29/23 0908 (!) 102     Resp 07/29/23 0908 15     Temp 07/29/23 0908 98.9 F (37.2 C)     Temp Source 07/29/23 0908 Oral     SpO2 07/29/23 0908 98 %     Weight --      Height --      Head Circumference --      Peak Flow --      Pain Score 07/29/23 0912 7     Pain Loc --      Pain Education --      Exclude from Growth Chart --    No data found.  Updated Vital Signs BP 138/85 (BP Location: Right Arm)   Pulse (!) 102   Temp 98.9 F (37.2 C) (Oral)   Resp 15   LMP 07/20/2023 (Exact Date)   SpO2 98%   Visual Acuity Right Eye Distance: 20/25 Left Eye Distance: 20/25 Bilateral Distance: 20/20  Right Eye Near:   Left Eye Near:    Bilateral Near:     Physical Exam Vitals and nursing note reviewed.  Constitutional:      General: She is not in acute distress.    Appearance: Normal appearance.  HENT:     Head: Normocephalic.     Nose: Congestion present.  Eyes:     General: Lids are normal. Vision grossly intact. No visual field deficit.       Left eye: Discharge (excessive tearing) present.No foreign body or hordeolum.     Extraocular Movements: Extraocular movements intact.     Left eye: Normal extraocular motion and no nystagmus.     Conjunctiva/sclera:     Left eye: Left conjunctiva is not injected. No chemosis, exudate or hemorrhage.    Pupils: Pupils are equal, round, and reactive to light.      Comments: Moderate erythema of the conjunctiva. Eye Exam: Eyelids everted and swept for foreign body. The eye was anesthetized  with 2 drops of Tetracaine and stained with fluorescein. Examination under woods lamp does reveal a foreign body or area of increased stain uptake.  Intake noted from the 3 o'clock position  extending to the 7 o'clock position. The eye was then irrigated copiously with saline.   Pulmonary:     Effort: Pulmonary effort is normal.  Musculoskeletal:     Cervical back: Normal range of motion.  Skin:    General: Skin is warm and dry.  Neurological:     General: No focal deficit present.     Mental Status: She is alert and oriented to person, place, and time.  Psychiatric:        Mood and Affect: Mood normal.        Behavior: Behavior normal.      UC Treatments / Results  Labs (all labs ordered are listed, but only abnormal results are displayed) Labs Reviewed - No data to display  EKG   Radiology No results found.  Procedures Procedures (including critical care time)  Medications Ordered in UC Medications - No data to display  Initial Impression / Assessment and Plan / UC Course  I have reviewed the triage vital signs and the nursing notes.  Pertinent labs & imaging results that were available during my care of the patient were reviewed by me and considered in my medical decision making (see chart for details).  The patient is well-appearing, she is in no acute distress, she she is mildly tachycardic.  Fluorescein stain performed of the left eye.  Uptake noted from the 3 o'clock position extending to the 7 o'clock position of the left cornea.  Tdap updated.  Polytrim ophthalmic solution prescribed for corneal abrasion, along with Augmentin 875/125 mg to cover for possible infection.  Supportive care recommendations were provided and discussed with the patient to include use of over-the-counter analgesics such as Tylenol or ibuprofen, warm compresses to help with pain discomfort, cool compresses to help with swelling, and use of Visine or Clear Eyes to help keep the eyes moist and  lubricated.  Patient was advised to follow-up with her ophthalmologist within the next 24 hours for reevaluation. Work note was provided.    Final Clinical Impressions(s) / UC Diagnoses   Final diagnoses:  Left corneal abrasion, initial encounter  Need for vaccination     Discharge Instructions      Take medication as prescribed. May take over-the-counter Tylenol or ibuprofen as needed for pain, fever, or general discomfort. May apply warm compresses to the eye to help with pain or discomfort, apply cool compresses to help with swelling. Use over-the-counter Visine or Clear Eyes eyedrops to help keep the eye moist and lubricated. Do not wear contacts until symptoms have completely resolved. Avoid rubbing or manipulating the area while symptoms persist. You may need to wear sunglasses to help with light sensitivity. As discussed, I would like for you to follow-up with your ophthalmologist within the next 24 hours for reevaluation. Go to the emergency department immediately if you experience sudden loss of vision, or other concerns. Follow-up as needed.     ED Prescriptions     Medication Sig Dispense Auth. Provider   erythromycin ophthalmic ointment Place 1 Application into the left eye 4 (four) times daily for 7 days. 28 g Raylene Carmickle-Warren, Sadie Haber, NP   amoxicillin-clavulanate (AUGMENTIN) 875-125 MG tablet Take 1 tablet by mouth every 12 (twelve) hours for 5 days. 10 tablet Dajanae Brophy-Warren, Sadie Haber, NP      PDMP not reviewed this encounter.   Abran Cantor, NP 07/29/23 [phone]

## 2023-07-29 NOTE — ED Triage Notes (Signed)
Pt c/o cat scratch to the left eye. Pt believes the cat scratched her eyeball, states this happened this morning around 4:30 am, when she is able to open her eye she can see. Light sensitivity

## 2023-07-29 NOTE — Discharge Instructions (Signed)
Take medication as prescribed. May take over-the-counter Tylenol or ibuprofen as needed for pain, fever, or general discomfort. May apply warm compresses to the eye to help with pain or discomfort, apply cool compresses to help with swelling. Use over-the-counter Visine or Clear Eyes eyedrops to help keep the eye moist and lubricated. Do not wear contacts until symptoms have completely resolved. Avoid rubbing or manipulating the area while symptoms persist. You may need to wear sunglasses to help with light sensitivity. As discussed, I would like for you to follow-up with your ophthalmologist within the next 24 hours for reevaluation. Go to the emergency department immediately if you experience sudden loss of vision, or other concerns. Follow-up as needed.

## 2024-03-25 ENCOUNTER — Telehealth: Payer: Self-pay | Admitting: Internal Medicine

## 2024-03-25 NOTE — Telephone Encounter (Signed)
 Patient called and requested that her records be forwarded to [facility]

## 2024-04-06 ENCOUNTER — Telehealth: Payer: Self-pay | Admitting: Internal Medicine

## 2024-04-06 NOTE — Telephone Encounter (Signed)
 Patient requested we send her medical records to [facility].... they were sent 6/4 but the patient said they didn't receive them.

## 2024-04-29 ENCOUNTER — Other Ambulatory Visit (HOSPITAL_COMMUNITY): Payer: Self-pay | Admitting: Gastroenterology

## 2024-04-29 DIAGNOSIS — R110 Nausea: Secondary | ICD-10-CM

## 2024-05-11 ENCOUNTER — Encounter (HOSPITAL_COMMUNITY): Payer: Self-pay

## 2024-05-11 ENCOUNTER — Encounter (HOSPITAL_COMMUNITY)
Admission: RE | Admit: 2024-05-11 | Discharge: 2024-05-11 | Disposition: A | Discharge status: Home / Self Care | Source: Ambulatory Visit | Attending: Gastroenterology | Admitting: Gastroenterology

## 2024-05-11 DIAGNOSIS — R110 Nausea: Secondary | ICD-10-CM | POA: Diagnosis present

## 2024-05-11 MED ORDER — TECHNETIUM TC 99M MEBROFENIN IV KIT
5.3500 | PACK | Freq: Once | INTRAVENOUS | Status: AC
  Administered 2024-05-11: 5.35 via INTRAVENOUS

## 2024-11-16 ENCOUNTER — Ambulatory Visit (HOSPITAL_COMMUNITY): Admitting: Registered Nurse

## 2024-11-16 ENCOUNTER — Encounter (HOSPITAL_COMMUNITY): Payer: Self-pay | Admitting: Registered Nurse

## 2024-11-16 DIAGNOSIS — R632 Polyphagia: Secondary | ICD-10-CM

## 2024-11-16 DIAGNOSIS — Z79899 Other long term (current) drug therapy: Secondary | ICD-10-CM | POA: Diagnosis not present

## 2024-11-16 DIAGNOSIS — F902 Attention-deficit hyperactivity disorder, combined type: Secondary | ICD-10-CM | POA: Diagnosis not present

## 2024-11-16 DIAGNOSIS — F429 Obsessive-compulsive disorder, unspecified: Secondary | ICD-10-CM

## 2024-11-16 MED ORDER — LISDEXAMFETAMINE DIMESYLATE 30 MG PO CAPS: 30.0000 mg | ORAL_CAPSULE | Freq: Every day | ORAL | 0 refills | Status: AC

## 2024-11-16 MED ORDER — FLUOXETINE HCL 10 MG PO CAPS: 10.0000 mg | ORAL_CAPSULE | Freq: Every day | ORAL | 1 refills | Status: AC

## 2024-11-16 NOTE — Addendum Note (Signed)
 Addended by: Vina Byrd B on: 11/16/2024 11:20 AM   Modules accepted: Orders

## 2024-11-16 NOTE — Progress Notes (Signed)
 Orders Placed This Encounter  Procedures   TSH   Comprehensive metabolic panel with GFR   CBC with Differential   Lipid panel   Prolactin   Magnesium    hCG, serum, qualitative   HgB A1c   Drugs of abuse screen w/o alc (for BH OP)   Ambulatory referral to Psychiatry    Referral Priority:   Routine    Referral Type:   Psychiatric    Referral Reason:   Specialty Services Required    Requested Specialty:   Psychiatry    Number of Visits Requested:   1   Referral Orders         Ambulatory referral to Psychiatry

## 2024-11-16 NOTE — Patient Instructions (Addendum)
 If no one has contacted, you by the end of business day today please call the appropriate office listed below to schedule your next visit for medication management with Luisa Ruder, NP:    [facility] [address] [place], [place], [place], [place] [address]  Phone: [phone] (Call to schedule appointment)  [facility] [address] [place], [place] [address] Phone: [phone] (Call to schedule appointment)   Routine Lab Work:  Labs have been ordered since it has been a while since you've had lab work done.   Labs should be checked at least yearly, or more frequently depending on prescribed medications. Routine blood work helps assess overall health and rule out non-psychiatric conditions. Important tests include: Complete Blood Count (CBC) with Differential/Platelet Comprehensive Metabolic Panel Hemoglobin A1c (diabetes screening) Magnesium Ethanol Urinalysis (Routine with reflex microscopic, Clean Catch) Pregnancy test (urine, for females of childbearing age) POCT Urine Drug Screen (if prescribed any controlled substance) EKG Recommendations: An EKG is recommended before starting psychotropic medications and periodically thereafter to monitor for prolonged QTc, which can indicate cardiac risk factors. Some psychotropic medications can increase the risk of prolonged QTc. Have your primary care provider perform an EKG at your next visit and send the results if not available on Epic. If you are unable to have done at your primary doctors office please let provider know and a referral/order can be made to have done.   Please ensure you have your labs drawn before your next scheduled visit.  Call 911, [phone], mobile crisis, or present to the nearest emergency room should you experience any suicidal/homicidal ideation, auditory/visual/hallucinations, or detrimental worsening of your mental  health.  Mobile Crisis Response Teams Listed by counties in vicinity of [facility] providers [facility] [employer] [employer] [phone] [facility] [phone] [facility] [phone] [facility] [phone] [place]                * [facility] [phone]                * [employer] [phone]  [facility] [employer] [phone] [facility] [employer]  [phone] * [employer] [phone]
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
## 2024-12-21 ENCOUNTER — Ambulatory Visit (HOSPITAL_COMMUNITY): Admitting: Registered Nurse
